# Patient Record
Sex: Female | Born: 1965 | Race: White | Hispanic: No | Smoking: Never smoker
Health system: Southern US, Community
[De-identification: ages and names within clinical notes are randomized; demographics above are authoritative.]

## PROBLEM LIST (undated history)

## (undated) DIAGNOSIS — G9349 Other encephalopathy: Secondary | ICD-10-CM

## (undated) DIAGNOSIS — K589 Irritable bowel syndrome without diarrhea: Secondary | ICD-10-CM

## (undated) DIAGNOSIS — F32A Depression, unspecified: Secondary | ICD-10-CM

## (undated) DIAGNOSIS — E039 Hypothyroidism, unspecified: Secondary | ICD-10-CM

## (undated) DIAGNOSIS — I639 Cerebral infarction, unspecified: Secondary | ICD-10-CM

## (undated) DIAGNOSIS — F329 Major depressive disorder, single episode, unspecified: Secondary | ICD-10-CM

## (undated) DIAGNOSIS — F419 Anxiety disorder, unspecified: Secondary | ICD-10-CM

## (undated) DIAGNOSIS — E669 Obesity, unspecified: Secondary | ICD-10-CM

## (undated) HISTORY — DX: Major depressive disorder, single episode, unspecified: F32.9

## (undated) HISTORY — DX: Hypothyroidism, unspecified: E03.9

## (undated) HISTORY — DX: Depression, unspecified: F32.A

## (undated) HISTORY — DX: Other encephalopathy: G93.49

## (undated) HISTORY — DX: Anxiety disorder, unspecified: F41.9

## (undated) HISTORY — DX: Irritable bowel syndrome, unspecified: K58.9

## (undated) HISTORY — DX: Obesity, unspecified: E66.9

---

## 2007-06-29 HISTORY — PX: COLONOSCOPY: SHX174

## 2014-07-08 HISTORY — PX: VAGINA SURGERY: SHX829

## 2015-01-31 DIAGNOSIS — R32 Unspecified urinary incontinence: Secondary | ICD-10-CM | POA: Insufficient documentation

## 2015-01-31 DIAGNOSIS — R159 Full incontinence of feces: Secondary | ICD-10-CM | POA: Insufficient documentation

## 2015-01-31 DIAGNOSIS — E039 Hypothyroidism, unspecified: Secondary | ICD-10-CM | POA: Insufficient documentation

## 2015-01-31 DIAGNOSIS — Z8673 Personal history of transient ischemic attack (TIA), and cerebral infarction without residual deficits: Secondary | ICD-10-CM | POA: Insufficient documentation

## 2015-01-31 DIAGNOSIS — F3130 Bipolar disorder, current episode depressed, mild or moderate severity, unspecified: Secondary | ICD-10-CM | POA: Insufficient documentation

## 2015-05-03 DIAGNOSIS — I639 Cerebral infarction, unspecified: Secondary | ICD-10-CM | POA: Insufficient documentation

## 2015-06-28 DIAGNOSIS — H5347 Heteronymous bilateral field defects: Secondary | ICD-10-CM | POA: Insufficient documentation

## 2015-11-27 DIAGNOSIS — H34233 Retinal artery branch occlusion, bilateral: Secondary | ICD-10-CM | POA: Insufficient documentation

## 2015-11-27 DIAGNOSIS — H53433 Sector or arcuate defects, bilateral: Secondary | ICD-10-CM | POA: Insufficient documentation

## 2015-11-27 DIAGNOSIS — H2513 Age-related nuclear cataract, bilateral: Secondary | ICD-10-CM | POA: Insufficient documentation

## 2016-01-24 ENCOUNTER — Encounter: Payer: Self-pay | Admitting: Sports Medicine

## 2016-01-24 ENCOUNTER — Ambulatory Visit (INDEPENDENT_AMBULATORY_CARE_PROVIDER_SITE_OTHER): Payer: BC Managed Care – PPO | Admitting: Sports Medicine

## 2016-01-24 ENCOUNTER — Encounter (INDEPENDENT_AMBULATORY_CARE_PROVIDER_SITE_OTHER): Payer: Self-pay

## 2016-01-24 ENCOUNTER — Ambulatory Visit: Payer: BC Managed Care – PPO

## 2016-01-24 DIAGNOSIS — M79672 Pain in left foot: Secondary | ICD-10-CM | POA: Diagnosis not present

## 2016-01-24 DIAGNOSIS — M25872 Other specified joint disorders, left ankle and foot: Secondary | ICD-10-CM

## 2016-01-24 DIAGNOSIS — M2042 Other hammer toe(s) (acquired), left foot: Secondary | ICD-10-CM

## 2016-01-24 NOTE — Progress Notes (Signed)
Patient ID: Margaret Briggs, female   DOB: 1965/09/23, 50 y.o.   MRN: [id number] Subjective: Margaret Briggs is a 50 y.o. female patient who presents to office for evaluation of Left foot pain. Patient complains of progressive pain especially over the last 3 weeks at left foot at the 2nd toe admits that she notices the 2nd and 3rd toes drifting apart. Denies injury or causative factor. Admits to fam hx of bunions and hammertoes. Has tried buddy taping left 2-3 toe together with no relief. Patient denies any other pedal complaints.   Patient Active Problem List   Diagnosis Date Noted  . 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 Cerebrovascular accident (CVA) ([place]) 05/03/2015  . Acquired hypothyroidism 01/31/2015  . Bipolar I disorder, most recent episode depressed ([place]) 01/31/2015  . Alteration in bowel elimination: incontinence 01/31/2015  . Cerebrovascular accident, old 01/31/2015  . Absence of bladder continence 01/31/2015    No current outpatient prescriptions on file prior to visit.   No current facility-administered medications on file prior to visit.    No Known Allergies  Objective:  General: Alert and oriented x3 in no acute distress  Dermatology: Minimal hyperkeratotic lesion overlying 2 PIPJ dorsally Left>Right. No open lesions bilateral lower extremities, no webspace macerations, no ecchymosis bilateral, all nails x 10 are well manicured.  Vascular: Dorsalis Pedis and Posterior Tibial pedal pulses 2/4, Capillary Fill Time 3 seconds,(+) pedal hair growth bilateral, no edema bilateral lower extremities, Temperature gradient within normal limits.  Neurology: Johney Maine sensation intact via light touch bilateral, Protective sensation intact  with Semmes Weinstein Monofilament to all pedal sites, Position sense intact, vibratory intact bilateral, Deep tendon reflexes within normal limits bilateral, No babinski sign present bilateral. (- )Tinels sign bilateral.   Musculoskeletal: Semi-flexible hammertoes  2 L>R with Mild tenderness with palpation at 2nd MTPJ on left with significant medial dislocation with lachman present, Ankle, Subtalar, Midtarsal, and other MTPJs joint range of motion is within normal limits, there is no 1st ray hypermobility noted bilateral, No significant bunion deformity noted bilateral. No pain with calf compression bilateral.  Strength within normal limits in all groups bilateral.   Xrays  Left Foot    Impression:Normal osseous mineralization, Calcaneal spurs present, splaying of 2-3 toes with medial dislocation at 2nd MTPJ, no fracture, soft tissues within normal limits, no foreign body.       Assessment and Plan: Problem List Items Addressed This Visit    None    Visit Diagnoses    Left foot pain    -  Primary    Relevant Orders    DG Foot 2 Views Left    Hammertoe, left        Predislocation syndrome of metatarsophalangeal joint, left        2nd        -Complete examination performed -Xrays reviewed -Discussed treatement options for hammertoe with pre-dislocation syndrome L>R -Patient decline injection therapy  -Taped and strapped left 2nd toe to alievate pain at MTPJ and also supplied met pads to use as needed -Advised patient that symptoms worsens may benefit from MRI and injection -Patient to return to office as needed or sooner if condition worsens.  Landis Martins, DPM

## 2016-04-23 ENCOUNTER — Other Ambulatory Visit: Payer: Self-pay | Admitting: Physician Assistant

## 2016-04-23 DIAGNOSIS — S43431A Superior glenoid labrum lesion of right shoulder, initial encounter: Secondary | ICD-10-CM

## 2016-05-03 ENCOUNTER — Ambulatory Visit
Admission: RE | Admit: 2016-05-03 | Discharge: 2016-05-03 | Disposition: A | Discharge status: Home / Self Care | Payer: BC Managed Care – PPO | Source: Ambulatory Visit | Attending: Physician Assistant | Admitting: Physician Assistant

## 2016-05-03 DIAGNOSIS — X58XXXA Exposure to other specified factors, initial encounter: Secondary | ICD-10-CM | POA: Insufficient documentation

## 2016-05-03 DIAGNOSIS — S43431A Superior glenoid labrum lesion of right shoulder, initial encounter: Secondary | ICD-10-CM | POA: Diagnosis not present

## 2016-05-03 HISTORY — DX: Cerebral infarction, unspecified: I63.9

## 2016-09-16 NOTE — Addendum Note (Signed)
Encounter addended by: Arnitra Sokoloski S Rachelann Enloe, RT on: 09/16/2016 11:03 AM<BR>    Actions taken: Imaging Exam ended, Charge Capture section accepted

## 2017-05-22 IMAGING — US US ASPIRATION
1 series · 8 of 8 positions shown · non-contrast
Comparison: MRI a right shoulder 03/12/2016

CLINICAL DATA: Right posterior paralabral cyst with shoulder pain.

EXAM:
ULTRASOUND GUIDED RIGHT POSTERIOR PARALABRAL CYST ASPIRATION

[Series 1: us aspiration · 0.07mm/px · 8 of 8 slices shown]
[im 1/8]
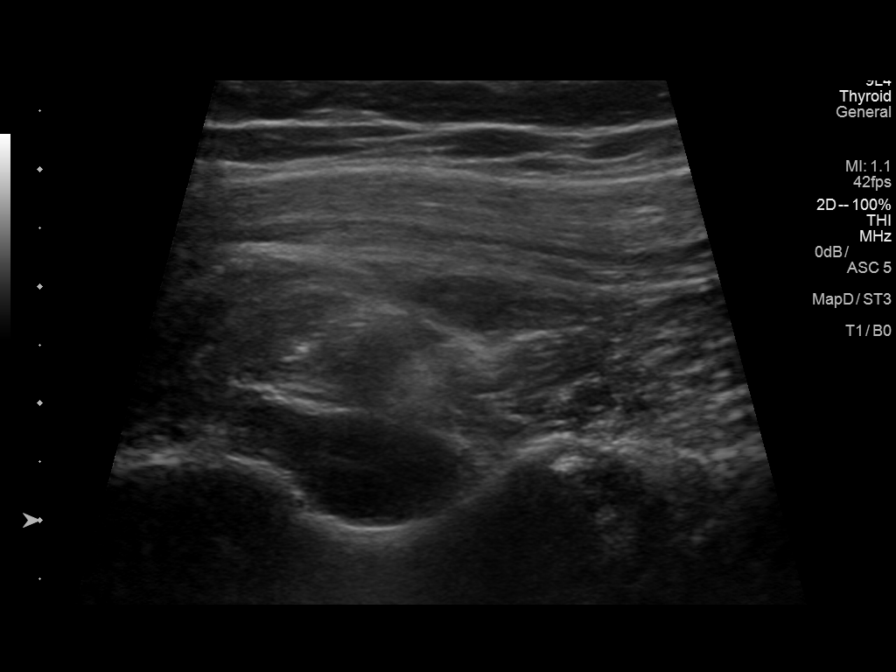
[im 2/8]
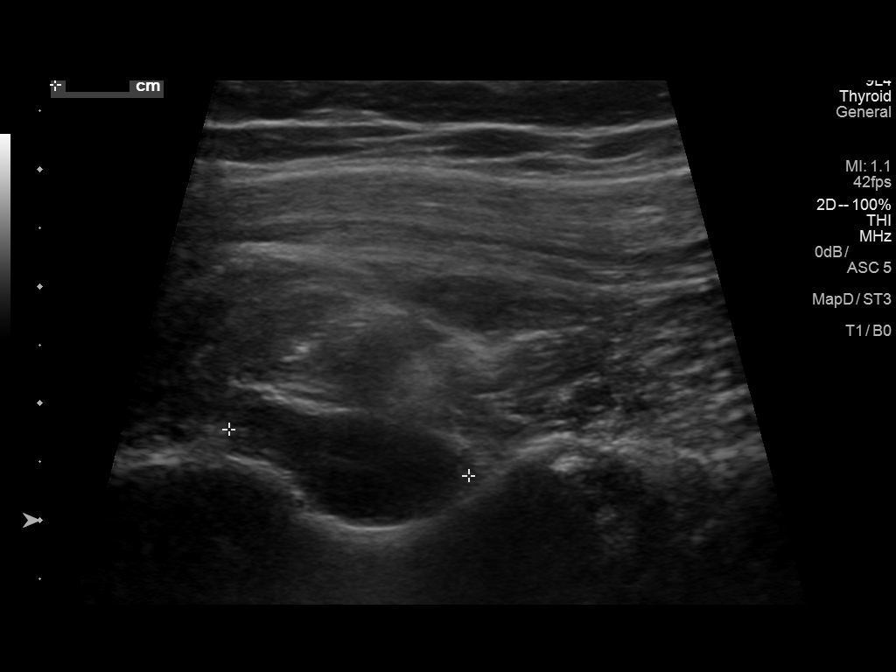
[im 3/8]
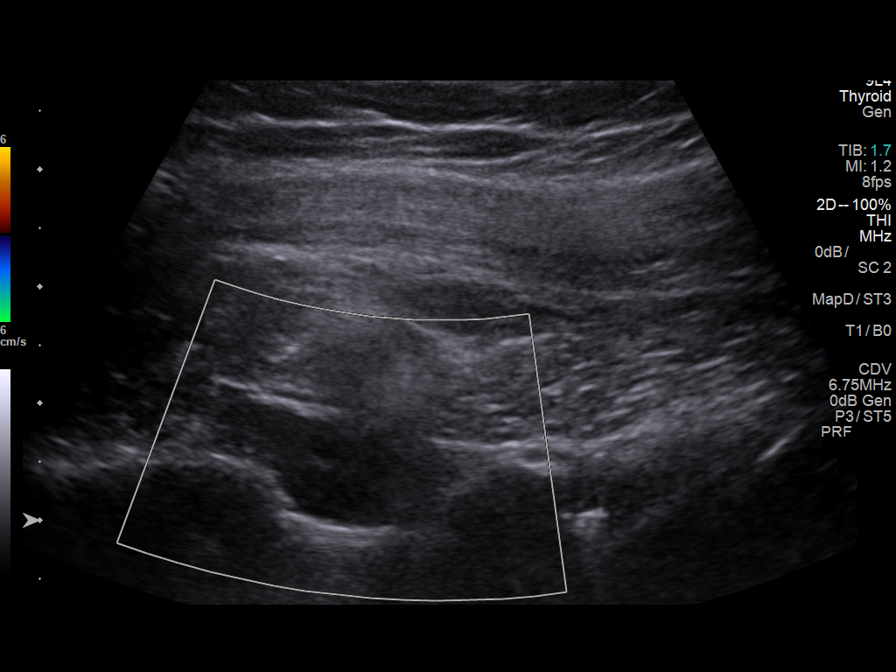
[im 4/8]
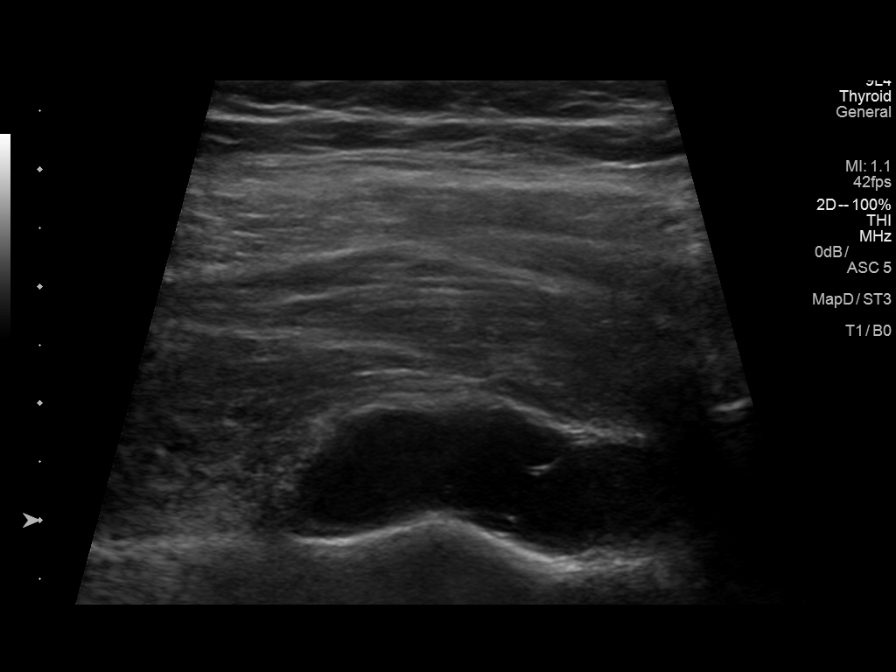
[im 5/8]
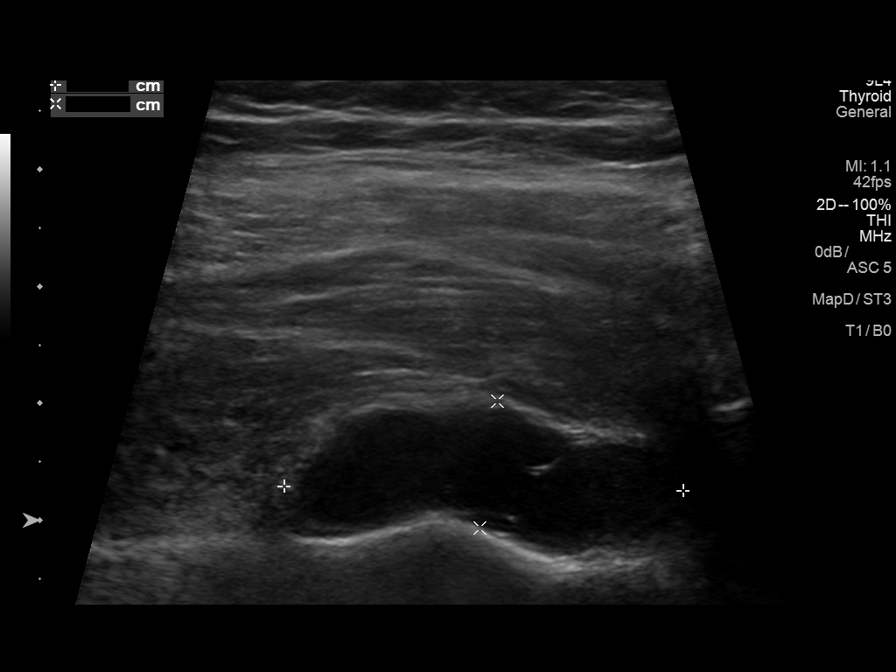
[im 6/8]
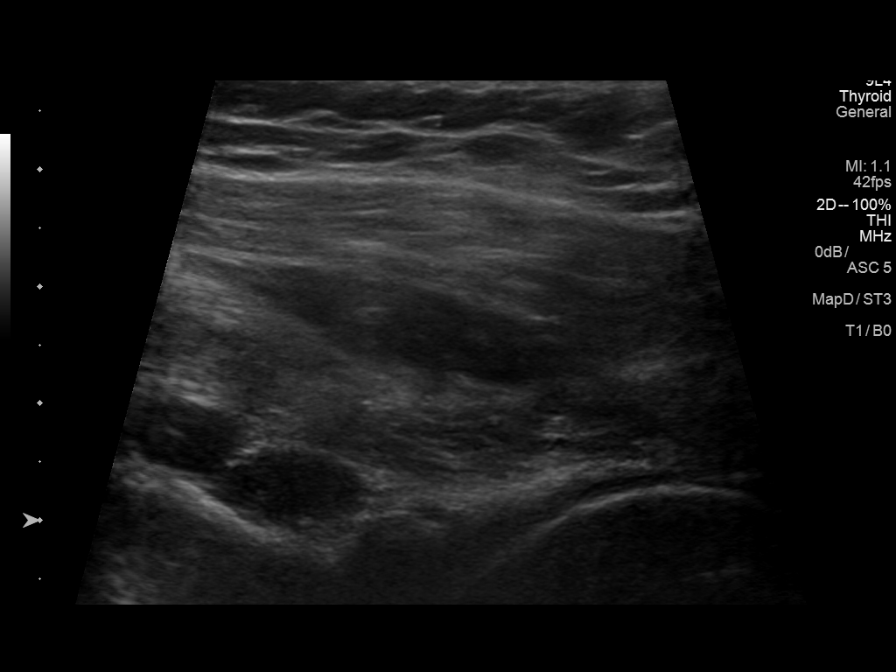
[im 7/8]
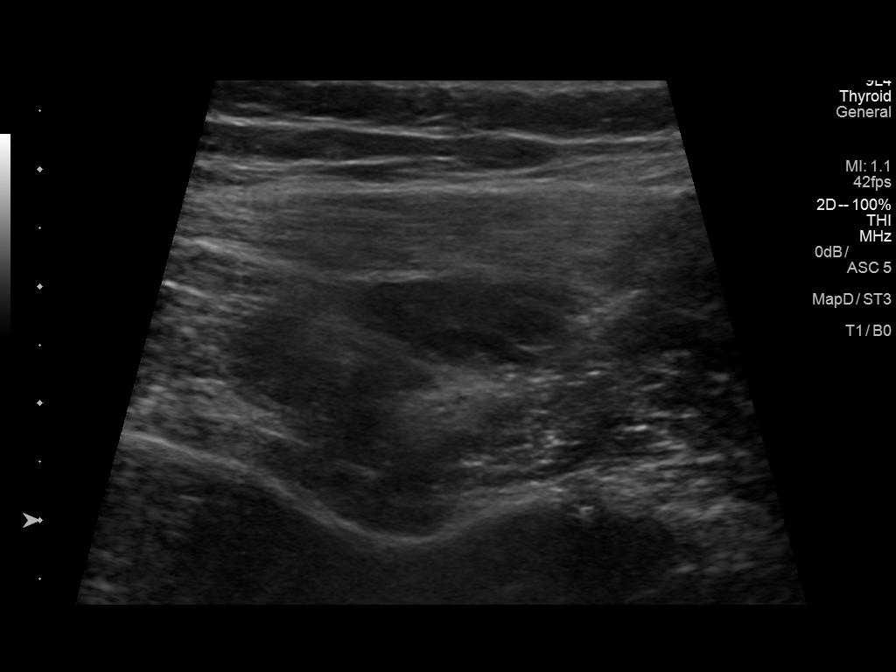
[im 8/8]
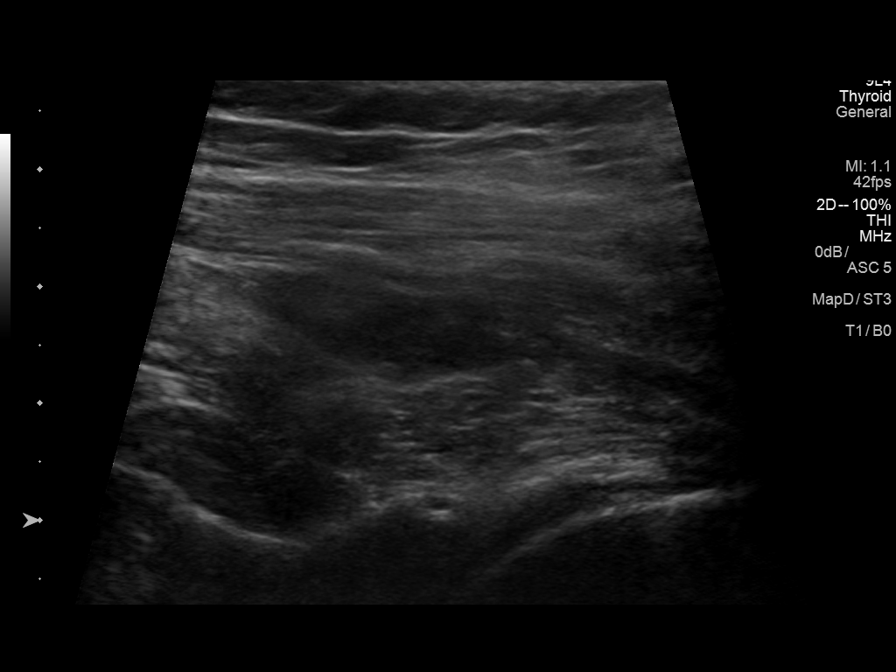

[8 of 8 positions shown; findings below may reference images not displayed]

PROCEDURE:
Using sterile technique, 1% lidocaine, under direct ultrasound
visualization, needle aspiration of a right posterior paralabral
cyst was performed.

After informed consent was obtained explaining the risks, benefits,
and possible complications of the procedure, the patient was placed
supine on the fluoroscopy table. A formal time procedure was
performed according to department of protocol.

The right posterior shoulder was prepped and draped in the usual
sterile fashion. 1% lidocaine was used to anesthetize the skin. A 18
gauge spinal needle was advanced into the right posterior paralabral
cyst under real-time sonography. 7 mL of thick viscous synovial
fluid was aspirated.

Subsequently, 5 ml of bupivacaine and 60 mg of Depo-Medrol were
injected into the right posterior paralabral cyst.

Hemostasis was achieved. Patient tolerated the procedure well. There
were no immediate complications.
IMPRESSION: Ultrasound-guided aspiration of a right posterior paralabral cyst.
No apparent complications.

RECOMMENDATIONS:
None.

## 2017-12-09 ENCOUNTER — Encounter: Payer: Self-pay | Admitting: Gastroenterology

## 2018-01-09 ENCOUNTER — Encounter: Payer: Self-pay | Admitting: Gastroenterology

## 2018-01-20 ENCOUNTER — Ambulatory Visit (INDEPENDENT_AMBULATORY_CARE_PROVIDER_SITE_OTHER): Payer: BC Managed Care – PPO | Admitting: Gastroenterology

## 2018-01-20 ENCOUNTER — Encounter: Payer: Self-pay | Admitting: Gastroenterology

## 2018-01-20 VITALS — BP 104/60 | HR 78 | Ht 68.0 in | Wt 250.0 lb

## 2018-01-20 DIAGNOSIS — Z1211 Encounter for screening for malignant neoplasm of colon: Secondary | ICD-10-CM | POA: Diagnosis not present

## 2018-01-20 MED ORDER — SOD PICOSULFATE-MAG OX-CIT ACD 10-3.5-12 MG-GM -GM/160ML PO SOLN: 1.0000 | Freq: Once | ORAL | 0 refills | Status: AC

## 2018-01-20 NOTE — Patient Instructions (Addendum)
If you are age 965 or older, your body mass index should be between 23-30. Your Body mass index is 38.01 kg/m. If this is out of the aforementioned range listed, please consider follow up with your Primary Care Provider.  If you are age 50 or younger, your body mass index should be between 19-25. Your Body mass index is 38.01 kg/m. If this is out of the aformentioned range listed, please consider follow up with your Primary Care Provider.   You have been scheduled for a colonoscopy. Please follow written instructions given to you at your visit today.  Please pick up your prep supplies at the pharmacy within the next 1-3 days. If you use inhalers (even only as needed), please bring them with you on the day of your procedure. Your physician has requested that you go to www.startemmi.com and enter the access code given to you at your visit today. This web site gives a general overview about your procedure. However, you should still follow specific instructions given to you by our office regarding your preparation for the procedure.  We have sent the following medications to your pharmacy for you to pick up at your convenience: Clenpiq   Thank you,  Dr. Lynann Bolognaajesh Gupta

## 2018-01-20 NOTE — Progress Notes (Signed)
Chief Complaint:   Referring Provider:  Philemon Kingdom, MD      ASSESSMENT AND PLAN;   #1.  Colorectal cancer screening - Proceed with colonoscopy.  I have discussed the risks and benefits.  The risks including risk of perforation requiring laparotomy, bleeding after polypectomy requiring blood transfusions and risks of anesthesia/sedation were discussed.  Rare risks of missing colorectal neoplasms were also discussed.  Alternatives were given.  Patient is fully aware and agrees to proceed. All the questions were answered. Colonoscopy will be scheduled in upcoming days.  Patient is to report immediately if there is any significant weight loss or excessive bleeding until then. Consent forms were given for review.  #2.  IBS with alternating constipation and diarrhea. -Continue Levsin 0.125 mg sublingual as needed.   HPI:    Margaret Briggs is a 52 y.o. 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 With a lot of mucus but no blood No nocturnal symptoms. No upper GI symptoms   Past GI history: -Had colonoscopy 06/29/2007 ([facility])-negative, No UC  (? UC 1996)   Past Medical History:  Diagnosis Date  . Anxiety and depression   . Hypothyroid   . IBS (irritable bowel syndrome)   . Obesity   . Stroke ([facility])   . Susac's syndrome     Past Surgical History:  Procedure Laterality Date  . COLONOSCOPY  06/29/2007   Minimal eryhtema of tje rectum questionavle inmportance up to 10 cm. Small internal hemorrhoids.   Marland Kitchen VAGINA SURGERY  2016   vaginal wall repair    Family History  Problem Relation Age of Onset  . Breast cancer Mother        Died at age 45  . Osteoporosis Mother   . Rheum arthritis Mother   . Irritable bowel syndrome Father   . Colon cancer Neg Hx     Social History   Tobacco Use  . Smoking status: Never Smoker  . Smokeless tobacco: Never Used  Substance Use Topics  . Alcohol use: No   Alcohol/week: 0.0 oz  . Drug use: No    Current Outpatient Medications  Medication Sig Dispense Refill  . ALPRAZolam (XANAX) 0.5 MG tablet Take by mouth.    Marland Kitchen aspirin 81 MG chewable tablet Chew 81 mg by mouth daily.    . citalopram (CELEXA) 40 MG tablet Take by mouth.    . hyoscyamine (LEVSIN, ANASPAZ) 0.125 MG tablet Take 0.125 mg by mouth every 6 (six) hours as needed.    . lamoTRIgine (LAMICTAL) 150 MG tablet Take by mouth.    . lamoTRIgine (LAMICTAL) 150 MG tablet     . levothyroxine (SYNTHROID, LEVOTHROID) 100 MCG tablet Take by mouth.    . levothyroxine (SYNTHROID, LEVOTHROID) 100 MCG tablet     . mycophenolate (CELLCEPT) 500 MG tablet      No current facility-administered medications for this visit.     No Known Allergies  Review of Systems:  Constitutional: Denies fever, chills, diaphoresis, appetite change and fatigue.  HEENT: Denies photophobia, eye pain, redness, hearing loss, ear pain, congestion, sore throat, rhinorrhea, sneezing, mouth sores, neck pain, neck stiffness and tinnitus.   Respiratory: Denies SOB, DOE, cough, chest tightness,  and wheezing.   Cardiovascular: Denies chest pain, palpitations and leg swelling.  Genitourinary: Denies dysuria, urgency, frequency, hematuria, flank pain and difficulty urinating.  Musculoskeletal: Denies myalgias, back pain, joint swelling, arthralgias and gait problem.  Skin: No  rash.  Neurological: Denies dizziness, seizures, syncope, weakness, light-headedness, numbness and headaches.  Hematological: Denies adenopathy. Easy bruising, personal or family bleeding history  Psychiatric/Behavioral: Has anxiety or depression     Physical Exam:    BP 104/60   Pulse 78   Ht 5\' 8"  (1.727 m)   Wt 250 lb (113.4 kg)   BMI 38.01 kg/m  Filed Weights   01/20/18 1059  Weight: 250 lb (113.4 kg)   Constitutional:  Well-developed, in no acute distress. Psychiatric: Normal mood and affect. Behavior is normal. HEENT: Pupils normal.   Conjunctivae are normal. No scleral icterus. Neck supple.  Cardiovascular: Normal rate, regular rhythm. No edema Pulmonary/chest: Effort normal and breath sounds normal. No wheezing, rales or rhonchi. Abdominal: Soft, nondistended. Nontender. Bowel sounds active throughout. There are no masses palpable. No hepatomegaly. Rectal:  defered Neurological: Alert and oriented to person place and time. Skin: Skin is warm and dry. No rashes noted.  Seen in presence of Oliver BarreHeather Cole CMA.    Edman Circleaj Raed Schalk, MD 01/20/2018, 11:13 AM  Cc: Philemon KingdomProchnau, Caroline, MD

## 2018-01-23 ENCOUNTER — Encounter: Payer: Self-pay | Admitting: Gastroenterology

## 2018-02-06 ENCOUNTER — Encounter: Payer: Self-pay | Admitting: Gastroenterology

## 2018-02-06 ENCOUNTER — Ambulatory Visit (AMBULATORY_SURGERY_CENTER): Payer: BC Managed Care – PPO | Admitting: Gastroenterology

## 2018-02-06 VITALS — BP 128/82 | HR 71 | Temp 98.7°F | Resp 16 | Ht 68.0 in | Wt 250.0 lb

## 2018-02-06 DIAGNOSIS — Z1211 Encounter for screening for malignant neoplasm of colon: Secondary | ICD-10-CM

## 2018-02-06 MED ORDER — SODIUM CHLORIDE 0.9 % IV SOLN: 500.0000 mL | Freq: Once | INTRAVENOUS | Status: DC

## 2018-02-06 NOTE — Op Note (Signed)
[facility] Patient Name: Margaret Briggs Procedure Date: 02/06/2018 3:29 PM MRN: [id number] Endoscopist: Lynann Bologna , MD Age: 52 Referring MD:  Date of Birth: [birth year] Gender: Female Account #: [id number] Procedure:                Colonoscopy Indications:              Screening for colorectal malignant neoplasm Medicines:                Monitored Anesthesia Care Procedure:                Pre-Anesthesia Assessment:                           - Prior to the procedure, a History and Physical                            was performed, and patient medications and                            allergies were reviewed. The patient's tolerance of                            previous anesthesia was also reviewed. The risks                            and benefits of the procedure and the sedation                            options and risks were discussed with the patient.                            All questions were answered, and informed consent                            was obtained. Prior Anticoagulants: The patient has                            taken no previous anticoagulant or antiplatelet                            agents. ASA Grade Assessment: II - A patient with                            mild systemic disease. After reviewing the risks                            and benefits, the patient was deemed in                            satisfactory condition to undergo the procedure.                           After obtaining informed consent, the colonoscope  was passed under direct vision. Throughout the                            procedure, the patient's blood pressure, pulse, and                            oxygen saturations were monitored continuously. The                            Model PCF-[id number]) scope was introduced                            through the anus and advanced to the 2 cm into the                            ileum. The  colonoscopy was performed without                            difficulty. The patient tolerated the procedure                            well. The quality of the bowel preparation was good. Scope In: 3:36:23 PM Scope Out: 3:49:00 PM Scope Withdrawal Time: 0 hours 6 minutes 23 seconds  Total Procedure Duration: 0 hours 12 minutes 37 seconds  Findings:                 Non-bleeding internal hemorrhoids were found during                            retroflexion. The hemorrhoids were small.                           The exam was otherwise without abnormality on                            direct and retroflexion views. Complications:            No immediate complications. Estimated Blood Loss:     Estimated blood loss: none. Impression:               - Non-bleeding internal hemorrhoids.                           - Otherwise normal colonoscopy to terminal ileum. Recommendation:           - Patient has a contact number available for                            emergencies. The signs and symptoms of potential                            delayed complications were discussed with the                            patient. Return to normal activities tomorrow.  Written discharge instructions were provided to the                            patient.                           - Resume previous diet.                           - Continue present medications.                           - Repeat colonoscopy in 10 years for screening                            purposes. Earlier, if any new problems or if there                            is any change in family history.                           - Return to GI clinic PRN. Lynann Bologna, MD 02/06/2018 3:54:44 PM This report has been signed electronically.

## 2018-02-06 NOTE — Patient Instructions (Signed)
Thank you for allowing us to care for you today!  Resume previous medications and diet.     YOU HAD AN ENDOSCOPIC PROCEDURE TODAY AT THE [facility]:   Refer to the procedure report that was given to you for any specific questions about what was found during the examination.  If the procedure report does not answer your questions, please call your gastroenterologist to clarify.  If you requested that your care partner not be given the details of your procedure findings, then the procedure report has been included in a sealed envelope for you to review at your convenience later.  YOU SHOULD EXPECT: Some feelings of bloating in the abdomen. Passage of more gas than usual.  Walking can help get rid of the air that was put into your GI tract during the procedure and reduce the bloating. If you had a lower endoscopy (such as a colonoscopy or flexible sigmoidoscopy) you may notice spotting of blood in your stool or on the toilet paper. If you underwent a bowel prep for your procedure, you may not have a normal bowel movement for a few days.  Please Note:  You might notice some irritation and congestion in your nose or some drainage.  This is from the oxygen used during your procedure.  There is no need for concern and it should clear up in a day or so.  SYMPTOMS TO REPORT IMMEDIATELY:   Following lower endoscopy (colonoscopy or flexible sigmoidoscopy):  Excessive amounts of blood in the stool  Significant tenderness or worsening of abdominal pains  Swelling of the abdomen that is new, acute  Fever of 100F or higher    For urgent or emergent issues, a gastroenterologist can be reached at any hour by calling [phone].   DIET:  We do recommend a small meal at first, but then you may proceed to your regular diet.  Drink plenty of fluids but you should avoid alcoholic beverages for 24 hours.  ACTIVITY:  You should plan to take it easy for the rest of today and you should NOT  DRIVE or use heavy machinery until tomorrow (because of the sedation medicines used during the test).    FOLLOW UP: Our staff will call the number listed on your records the next business day following your procedure to check on you and address any questions or concerns that you may have regarding the information given to you following your procedure. If we do not reach you, we will leave a message.  However, if you are feeling well and you are not experiencing any problems, there is no need to return our call.  We will assume that you have returned to your regular daily activities without incident.  If any biopsies were taken you will be contacted by phone or by letter within the next 1-3 weeks.  Please call us at [phone] if you have not heard about the biopsies in 3 weeks.    SIGNATURES/CONFIDENTIALITY: You and/or your care partner have signed paperwork which will be entered into your electronic medical record.  These signatures attest to the fact that that the information above on your After Visit Summary has been reviewed and is understood.  Full responsibility of the confidentiality of this discharge information lies with you and/or your care-partner.

## 2018-02-09 ENCOUNTER — Telehealth: Payer: Self-pay | Admitting: *Deleted

## 2018-02-09 NOTE — Telephone Encounter (Signed)
  Follow up Call-  Call back number 02/06/2018  Post procedure Call Back phone  # [phone]  Permission to leave phone message Yes  Some recent data might be hidden     No answer at # given.  No VM. Unable to leave a message.

## 2018-02-09 NOTE — Telephone Encounter (Signed)
  Follow up Call-  Call back number 02/06/2018  Post procedure Call Back phone  # [phone]  Permission to leave phone message Yes  Some recent data might be hidden     Patient questions:  Do you have a fever, pain , or abdominal swelling? No. Pain Score  0 *  Have you tolerated food without any problems? Yes.    Have you been able to return to your normal activities? Yes.    Do you have any questions about your discharge instructions: Diet   No. Medications  No. Follow up visit  No.  Do you have questions or concerns about your Care? No.  Actions: * If pain score is 4 or above: No action needed, pain <4.

## 2018-05-12 ENCOUNTER — Other Ambulatory Visit: Payer: Self-pay | Admitting: Physician Assistant

## 2018-07-03 ENCOUNTER — Ambulatory Visit: Payer: Self-pay | Admitting: Physician Assistant

## 2018-07-04 ENCOUNTER — Other Ambulatory Visit: Payer: Self-pay | Admitting: Physician Assistant

## 2018-07-05 NOTE — Telephone Encounter (Signed)
Need to review paper chart not seen in epic yet  

## 2018-08-04 ENCOUNTER — Ambulatory Visit: Payer: Self-pay | Admitting: Physician Assistant

## 2018-08-17 ENCOUNTER — Other Ambulatory Visit: Payer: Self-pay | Admitting: Physician Assistant

## 2018-08-18 NOTE — Telephone Encounter (Signed)
Need to review paper chart  

## 2018-08-18 NOTE — Telephone Encounter (Signed)
Not seen since 10/23/2017 Follow up due in January   cxl 12/27 and 01/28  Next office visit  09/21/2018  Requesting refill for celexa, ok?   Chart on my desk

## 2018-08-19 NOTE — Telephone Encounter (Signed)
Yes, enough to get to appt.  Thanks

## 2018-09-21 ENCOUNTER — Ambulatory Visit: Payer: Self-pay | Admitting: Physician Assistant

## 2018-10-04 ENCOUNTER — Other Ambulatory Visit: Payer: Self-pay | Admitting: Physician Assistant

## 2018-10-05 ENCOUNTER — Ambulatory Visit (INDEPENDENT_AMBULATORY_CARE_PROVIDER_SITE_OTHER): Payer: BC Managed Care – PPO | Admitting: Physician Assistant

## 2018-10-05 ENCOUNTER — Encounter: Payer: Self-pay | Admitting: Physician Assistant

## 2018-10-05 DIAGNOSIS — F331 Major depressive disorder, recurrent, moderate: Secondary | ICD-10-CM | POA: Diagnosis not present

## 2018-10-05 DIAGNOSIS — F411 Generalized anxiety disorder: Secondary | ICD-10-CM | POA: Diagnosis not present

## 2018-10-05 MED ORDER — BUPROPION HCL ER (XL) 150 MG PO TB24: 150.0000 mg | ORAL_TABLET | Freq: Every day | ORAL | 1 refills | Status: DC

## 2018-10-05 MED ORDER — ALPRAZOLAM 0.5 MG PO TABS: 0.5000 mg | ORAL_TABLET | Freq: Two times a day (BID) | ORAL | 2 refills | Status: DC | PRN

## 2018-10-05 NOTE — Progress Notes (Signed)
[facility]  Patient ID: ICIE SMATHERS,  MRN: [id number]  PCP: Philemon Kingdom, MD  Date of Evaluation: 10/05/2018 Time spent:15 minutes  Chief Complaint:  Chief Complaint    Follow-up     Virtual Visit via Telephone Note  I connected with Margaret Briggs on 10/05/18 at  4:30 PM EDT by telephone and verified that I am speaking with the correct person using two identifiers.   I discussed the limitations, risks, security and privacy concerns of performing an evaluation and management service by telephone and the availability of in person appointments. I also discussed with the patient that there may be a patient responsible charge related to this service. The patient expressed understanding and agreed to proceed.    HISTORY/CURRENT STATUS: HPI  for routine med check.  States overall she is doing quite well.  She does feel some lassitude however.  No motivation to do much of anything.  She feels that menopause has played some part in the way she feels.  She does go to work as normal, is a Runner, broadcasting/film/video.  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  "I am really okay.  But I was wondering if we could tweak something and then I might feel a little bit more energetic with more motivation."  She is able to enjoy things.  She does not isolate.  She does not cry easily.  She sleeps well.  She has been a little bit more anxious lately due to the coronavirus pandemic.  She does use Xanax several days a week.  Denies suicidal or homicidal thoughts.  Denies muscle or joint pain, stiffness, or dystonia.  Denies dizziness, syncope, seizures, numbness, tingling, tremor, tics, unsteady gait, slurred speech, confusion.   Individual Medical History/ Review of Systems: Changes? :No   Past medications for mental health diagnoses include: Prozac, Wellbutrin, Paxil, Cymbalta ?  Allergies: Patient has no known  allergies.  Current Medications:  Current Outpatient Medications:  .  ALPRAZolam (XANAX) 0.5 MG tablet, Take 1 tablet (0.5 mg total) by mouth 2 (two) times daily as needed for anxiety., Disp: 60 tablet, Rfl: 2 .  aspirin 81 MG chewable tablet, Chew 81 mg by mouth daily., Disp: , Rfl:  .  citalopram (CELEXA) 40 MG tablet, TAKE 1 TABLET BY MOUTH ONCE DAILY IN THE MORNING -  NEEDS  OFFICE  VISIT, Disp: 33 tablet, Rfl: 0 .  hyoscyamine (LEVSIN, ANASPAZ) 0.125 MG tablet, Take 0.125 mg by mouth every 6 (six) hours as needed., Disp: , Rfl:  .  lamoTRIgine (LAMICTAL) 150 MG tablet, TAKE 1 TABLET BY MOUTH ONCE DAILY, Disp: 90 tablet, Rfl: 0 .  levothyroxine (SYNTHROID, LEVOTHROID) 100 MCG tablet, Take by mouth., Disp: , Rfl:  .  buPROPion (WELLBUTRIN XL) 150 MG 24 hr tablet, Take 1 tablet (150 mg total) by mouth daily., Disp: 30 tablet, Rfl: 1 .  mycophenolate (CELLCEPT) 500 MG tablet, , Disp: , Rfl:  Medication Side Effects: none  Family Medical/ Social History: Changes? No  MENTAL HEALTH EXAM:  There were no vitals taken for this visit.There is no height or weight on file to calculate BMI.  General Appearance: phone visit.  Unable to assess  Eye Contact:  unable to assess  Speech:  Clear and Coherent  Volume:  Normal  Mood:  Euthymic  Affect:  unable to assess  Thought Process:  Goal Directed  Orientation:  Full (Time, Place, and Person)  Thought Content: Logical   Suicidal Thoughts:  No  Homicidal Thoughts:  No  Memory:  WNL  Judgement:  Good  Insight:  Good  Psychomotor Activity:  unable to assess   Concentration:  Concentration: Good  Recall:  Good  Fund of Knowledge: Good  Language: Good  Assets:  Desire for Improvement  ADL's:  Intact  Cognition: WNL  Prognosis:  Good    DIAGNOSES:    ICD-10-CM   1. Major depressive disorder, recurrent episode, moderate (HCC) F33.1   2. Generalized anxiety disorder F41.1     Receiving Psychotherapy: No    RECOMMENDATIONS: We  discussed different options.  Recommend add Wellbutrin.  She is taking it in the past but does not remember whether it was effective or not.  She does remember that there were no side effects to it.  She is agreeable to try it. Start Wellbutrin XL 150 mg p.o. every morning. Continue Celexa 40 mg p.o. daily. Continue Xanax 0.5 mg twice daily as needed. Discussed ways to decompress during this anxiety provoking time. Return in 4 to 6 weeks.   Melony Overly, PA-C   This record has been created using [employer].  Chart creation errors have been sought, but may not always have been located and corrected. Such creation errors do not reflect on the standard of medical care.

## 2018-10-05 NOTE — Telephone Encounter (Signed)
Has phone visit 10/05/2018 with provider

## 2018-11-16 ENCOUNTER — Telehealth: Payer: Self-pay | Admitting: Physician Assistant

## 2018-11-16 NOTE — Telephone Encounter (Signed)
ELISANDRA ZETTS called to report that the new medicine caused insomnia "big time".  So she stopped it.  Next appt 12/04/18

## 2018-11-16 NOTE — Telephone Encounter (Signed)
Spoke with pt. And she says that is fine and thank you.

## 2018-11-16 NOTE — Telephone Encounter (Signed)
Let her know I'm sorry that happened!  If she's okay w/ this, I'd prefer to wait till her appt to discuss further and decide what to do.

## 2018-12-04 ENCOUNTER — Other Ambulatory Visit: Payer: Self-pay

## 2018-12-04 ENCOUNTER — Ambulatory Visit (INDEPENDENT_AMBULATORY_CARE_PROVIDER_SITE_OTHER): Payer: BC Managed Care – PPO | Admitting: Physician Assistant

## 2018-12-04 ENCOUNTER — Encounter: Payer: Self-pay | Admitting: Physician Assistant

## 2018-12-04 DIAGNOSIS — F411 Generalized anxiety disorder: Secondary | ICD-10-CM | POA: Diagnosis not present

## 2018-12-04 DIAGNOSIS — F331 Major depressive disorder, recurrent, moderate: Secondary | ICD-10-CM

## 2018-12-04 MED ORDER — BUPROPION HCL 75 MG PO TABS: 75.0000 mg | ORAL_TABLET | Freq: Two times a day (BID) | ORAL | 1 refills | Status: DC

## 2018-12-04 MED ORDER — CITALOPRAM HYDROBROMIDE 40 MG PO TABS: 40.0000 mg | ORAL_TABLET | Freq: Every day | ORAL | 5 refills | Status: DC

## 2018-12-04 NOTE — Progress Notes (Signed)
[facility]  Patient ID: Margaret Briggs,  MRN: [id number]  PCP: Philemon Kingdom, MD  Date of Evaluation: 12/04/2018 Time spent:15 minutes  Chief Complaint:  Chief Complaint    Follow-up     Virtual Visit via Telephone Note  I connected with patient by a video enabled telemedicine application or telephone, with their informed consent, and verified patient privacy and that I am speaking with the correct person using two identifiers.  I am private, in my home and the patient is home.  I discussed the limitations, risks, security and privacy concerns of performing an evaluation and management service by telephone and the availability of in person appointments. I also discussed with the patient that there may be a patient responsible charge related to this service. The patient expressed understanding and agreed to proceed.   I discussed the assessment and treatment plan with the patient. The patient was provided an opportunity to ask questions and all were answered. The patient agreed with the plan and demonstrated an understanding of the instructions.   The patient was advised to call back or seek an in-person evaluation if the symptoms worsen or if the condition fails to improve as anticipated.  I provided 15 minutes of non-face-to-face time during this encounter.  HISTORY/CURRENT STATUS: HPI for 47-month med check.  At the last visit, we added Wellbutrin.  Margaret Briggs had been having decreased energy and motivation.  She took the Wellbutrin for about 2 weeks but it caused insomnia so she stopped it.  She was not able to tell if it was doing anything for her anyway.  She still feels low energy and motivation and just does what she has to do.  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  "I feel so much better than I did 10 years ago so I do not want to complain.  Also I know that everybody else is dealing with this too.  The  coronavirus has this all a little bit depressed.  I am not suicidal.  I would cry all the time but I just do not feel as happy in excited about life as I have in the past."  She is not really having any anxiety to speak of.  Even through all these changes that are taking place with her job, she is not having panic attacks.  She is sleeping fine now that she is off the Wellbutrin.  Denies dizziness, syncope, seizures, numbness, tingling, tremor, tics, unsteady gait, slurred speech, confusion.   Denies muscle or joint pain, stiffness, or dystonia.  Individual Medical History/ Review of Systems: Changes? :No    Past medications for mental health diagnoses include: Prozac, Wellbutrin, Paxil, Cymbalta ?  Allergies: Patient has no known allergies.  Current Medications:  Current Outpatient Medications:  .  ALPRAZolam (XANAX) 0.5 MG tablet, Take 1 tablet (0.5 mg total) by mouth 2 (two) times daily as needed for anxiety., Disp: 60 tablet, Rfl: 2 .  aspirin 81 MG chewable tablet, Chew 81 mg by mouth daily., Disp: , Rfl:  .  citalopram (CELEXA) 40 MG tablet, Take 1 tablet (40 mg total) by mouth daily., Disp: 30 tablet, Rfl: 5 .  lamoTRIgine (LAMICTAL) 150 MG tablet, TAKE 1 TABLET BY MOUTH ONCE DAILY, Disp: 90 tablet, Rfl: 0 .  levothyroxine (SYNTHROID, LEVOTHROID) 100 MCG tablet, Take by mouth., Disp: , Rfl:  .  buPROPion (WELLBUTRIN) 75 MG tablet, Take 1 tablet (75 mg total)  by mouth 2 (two) times daily., Disp: 60 tablet, Rfl: 1 .  hyoscyamine (LEVSIN, ANASPAZ) 0.125 MG tablet, Take 0.125 mg by mouth every 6 (six) hours as needed., Disp: , Rfl:  .  mycophenolate (CELLCEPT) 500 MG tablet, , Disp: , Rfl:  Medication Side Effects: insomnia from Wellbutrin   Family Medical/ Social History: Changes? Yes coronavirus pandemic has affected her a lot.  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  MENTAL HEALTH EXAM:  There were no  vitals taken for this visit.There is no height or weight on file to calculate BMI.  General Appearance: unable to assess  Eye Contact:  unable to assess  Speech:  Clear and Coherent  Volume:  Normal  Mood:  Depressed  Affect:  Unable to assess  Thought Process:  Goal Directed  Orientation:  Full (Time, Place, and Person)  Thought Content: Logical   Suicidal Thoughts:  No  Homicidal Thoughts:  No  Memory:  WNL  Judgement:  Good  Insight:  Good  Psychomotor Activity:  Unable to assess  Concentration:  Concentration: Good  Recall:  Good  Fund of Knowledge: Good  Language: Good  Assets:  Desire for Improvement  ADL's:  Intact  Cognition: WNL  Prognosis:  Good    DIAGNOSES:    ICD-10-CM   1. Major depressive disorder, recurrent episode, moderate (HCC) F33.1   2. Generalized anxiety disorder F41.1     Receiving Psychotherapy: No    RECOMMENDATIONS:  We discussed different options including retrying Wellbutrin but at a lower dose of the instant release.  She prefers doing that than using Abilify which was the other alternative I proposed.  Even at a low dose, the Wellbutrin may help with energy and motivation yet not cause insomnia.  She understands though that it may not help the depression as much.  A lot of what she is feeling is circumstantial and I believe will improve after she knows more about the coming school year. Start Wellbutrin 75 mg 1 p.o. every morning for at least 2 weeks.  She then can take 1 every morning and 1 around 2 in the afternoon. Continue Celexa 40 mg daily. Continue Xanax 0.5 mg twice daily as needed.  States she rarely takes this. Return in 4 to 6 weeks.   Margaret Overlyeresa Jocelyne Reinertsen, PA-C   This record has been created using [employer] software.  Chart creation errors have been sought, but may not always have been located and corrected. Such creation errors do not reflect on the standard of medical care.

## 2019-01-11 ENCOUNTER — Encounter: Payer: Self-pay | Admitting: Physician Assistant

## 2019-01-11 ENCOUNTER — Ambulatory Visit (INDEPENDENT_AMBULATORY_CARE_PROVIDER_SITE_OTHER): Payer: BC Managed Care – PPO | Admitting: Physician Assistant

## 2019-01-11 ENCOUNTER — Other Ambulatory Visit: Payer: Self-pay

## 2019-01-11 DIAGNOSIS — F411 Generalized anxiety disorder: Secondary | ICD-10-CM

## 2019-01-11 DIAGNOSIS — F331 Major depressive disorder, recurrent, moderate: Secondary | ICD-10-CM | POA: Diagnosis not present

## 2019-01-11 NOTE — Progress Notes (Signed)
[facility]  Patient ID: Margaret Briggs,  MRN: [id number]  PCP: Ernestene Kiel, MD  Date of Evaluation: 01/11/2019 Time spent:15 minutes  Chief Complaint:  Chief Complaint    Depression; Follow-up     Virtual Visit via Telephone Note  I connected with patient by a video enabled telemedicine application or telephone, with their informed consent, and verified patient privacy and that I am speaking with the correct person using two identifiers.  I am private, in my office and the patient is home.   I discussed the limitations, risks, security and privacy concerns of performing an evaluation and management service by telephone and the availability of in person appointments. I also discussed with the patient that there may be a patient responsible charge related to this service. The patient expressed understanding and agreed to proceed.   I discussed the assessment and treatment plan with the patient. The patient was provided an opportunity to ask questions and all were answered. The patient agreed with the plan and demonstrated an understanding of the instructions.   The patient was advised to call back or seek an in-person evaluation if the symptoms worsen or if the condition fails to improve as anticipated.  I provided 15 minutes of non-face-to-face time during this encounter.  HISTORY/CURRENT STATUS: HPI for 6 week med check.   States she really has not been able to tell much difference since we added the Wellbutrin.  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  Energy is about the same.  She does not isolate.  Does not cry easily.  Denies suicidal or homicidal thoughts.  "I think I have felt down just because of everything that is going on in the world.  I think anybody would feel down a little bit."  Anxiety is well controlled with the Xanax.  Takes it as needed.  Patient denies increased energy with decreased need for sleep, no  increased talkativeness, no racing thoughts, no impulsivity or risky behaviors, no increased spending, no increased libido, no grandiosity.  Denies dizziness, syncope, seizures, numbness, tingling, tremor, tics, unsteady gait, slurred speech, confusion. Denies muscle or joint pain, stiffness, or dystonia.  Individual Medical History/ Review of Systems: Changes? :No    Past medications for mental health diagnoses include: Prozac, Wellbutrin, Paxil, Cymbalta ?  Allergies: Patient has no known allergies.  Current Medications:  Current Outpatient Medications:  .  ALPRAZolam (XANAX) 0.5 MG tablet, Take 1 tablet (0.5 mg total) by mouth 2 (two) times daily as needed for anxiety., Disp: 60 tablet, Rfl: 2 .  aspirin 81 MG chewable tablet, Chew 81 mg by mouth daily., Disp: , Rfl:  .  buPROPion (WELLBUTRIN) 75 MG tablet, Take 1 tablet (75 mg total) by mouth 2 (two) times daily., Disp: 60 tablet, Rfl: 1 .  citalopram (CELEXA) 40 MG tablet, Take 1 tablet (40 mg total) by mouth daily., Disp: 30 tablet, Rfl: 5 .  hyoscyamine (LEVSIN, ANASPAZ) 0.125 MG tablet, Take 0.125 mg by mouth every 6 (six) hours as needed., Disp: , Rfl:  .  lamoTRIgine (LAMICTAL) 150 MG tablet, TAKE 1 TABLET BY MOUTH ONCE DAILY, Disp: 90 tablet, Rfl: 0 .  levothyroxine (SYNTHROID, LEVOTHROID) 100 MCG tablet, Take by mouth., Disp: , Rfl:  .  mycophenolate (CELLCEPT) 500 MG tablet, , Disp: , Rfl:  Medication Side Effects: none  Family Medical/ Social History: Changes? No  MENTAL HEALTH EXAM:  There were no vitals taken for this  visit.There is no height or weight on file to calculate BMI.  General Appearance: Unable to assess  Eye Contact:  Unable to assess  Speech:  Clear and Coherent  Volume:  Normal  Mood:  Euthymic  Affect:  Unable to assess  Thought Process:  Goal Directed  Orientation:  Full (Time, Place, and Person)  Thought Content: Logical   Suicidal Thoughts:  No  Homicidal Thoughts:  No  Memory:  WNL   Judgement:  Good  Insight:  Good  Psychomotor Activity:  Unable to assess  Concentration:  Concentration: Good  Recall:  Good  Fund of Knowledge: Good  Language: Good  Assets:  Desire for Improvement  ADL's:  Intact  Cognition: WNL  Prognosis:  Good    DIAGNOSES:    ICD-10-CM   1. Major depressive disorder, recurrent episode, moderate (HCC)  F33.1   2. Generalized anxiety disorder  F41.1     Receiving Psychotherapy: No    RECOMMENDATIONS:  We discussed the fact that it is difficult to know whether this is caused by a biochemical issue versus situational things going on right now.  It is probably a little bit of both.  For now we agreed to make no changes and reassess in 1 month.  Another option would be to increase the Wellbutrin or Lamictal.  She prefers to keep things the same. Continue Xanax 0.5 mg 1 twice daily as needed. Continue Wellbutrin 75 mg 1 every morning and then 1 early afternoon. Continue Celexa 40 mg daily. Continue Lamictal 150 mg daily. Return in approximately 4 weeks.   Melony Overlyeresa Hurst, PA-C   This record has been created using [employer] software.  Chart creation errors have been sought, but may not always have been located and corrected. Such creation errors do not reflect on the standard of medical care.

## 2019-03-01 ENCOUNTER — Other Ambulatory Visit: Payer: Self-pay

## 2019-03-01 ENCOUNTER — Encounter: Payer: Self-pay | Admitting: Physician Assistant

## 2019-03-01 ENCOUNTER — Ambulatory Visit (INDEPENDENT_AMBULATORY_CARE_PROVIDER_SITE_OTHER): Payer: BC Managed Care – PPO | Admitting: Physician Assistant

## 2019-03-01 DIAGNOSIS — F331 Major depressive disorder, recurrent, moderate: Secondary | ICD-10-CM

## 2019-03-01 DIAGNOSIS — G2581 Restless legs syndrome: Secondary | ICD-10-CM | POA: Diagnosis not present

## 2019-03-01 DIAGNOSIS — F411 Generalized anxiety disorder: Secondary | ICD-10-CM | POA: Diagnosis not present

## 2019-03-01 MED ORDER — LAMOTRIGINE 100 MG PO TABS: 100.0000 mg | ORAL_TABLET | Freq: Two times a day (BID) | ORAL | 1 refills | Status: DC

## 2019-03-01 NOTE — Progress Notes (Signed)
[facility]  Patient ID: Margaret Briggs,  MRN: [id number]  PCP: Philemon KingdomProchnau, Caroline, MD  Date of Evaluation: 03/01/2019 Time spent:15 minutes  Chief Complaint:  Chief Complaint    Follow-up     Virtual Visit via Telephone Note  I connected with patient by a video enabled telemedicine application or telephone, with their informed consent, and verified patient privacy and that I am speaking with the correct person using two identifiers.  I am private, in my home and the patient is at work.  I discussed the limitations, risks, security and privacy concerns of performing an evaluation and management service by telephone and the availability of in person appointments. I also discussed with the patient that there may be a patient responsible charge related to this service. The patient expressed understanding and agreed to proceed.   I discussed the assessment and treatment plan with the patient. The patient was provided an opportunity to ask questions and all were answered. The patient agreed with the plan and demonstrated an understanding of the instructions.   The patient was advised to call back or seek an in-person evaluation if the symptoms worsen or if the condition fails to improve as anticipated.  I provided 15 minutes of non-face-to-face time during this encounter.  HISTORY/CURRENT STATUS: HPI For routine med check.   She couldn't tolerate the Wellbutrin.  Even at the low dose, it kept her up at night, or she had a really hard time going to sleep. So she stopped it.   "I do not think I am depressed or anxious.  I do not know.  I just feel overwhelmed.  I have not had a menstrual cycle in 3 years so some of this could just be physiologic as well as mental.  I just know that I am not real motivated to do anything and do not have a lot of energy.  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  It just gets overwhelming sometimes.  I am able to enjoy things but because  of coronavirus pandemic there is not a lot to do anyway.  I do not have any suicidal thoughts or anything like that."  Rarely takes the Xanax.  It helps when she does need it but she only uses it when she gets so overwhelmed that she cannot function like normal.  She sleeps okay most of the time.  She was recently diagnosed with restless leg syndrome by her PCP.  The Requip has really been helpful with that, allowing her to sleep better.  Denies dizziness, syncope, seizures, numbness, tingling, tremor, tics, unsteady gait, slurred speech, confusion. Denies muscle or joint pain, stiffness, or dystonia.  Individual Medical History/ Review of Systems: Changes? :No    Past medications for mental health diagnoses include: Prozac, Wellbutrin, Paxil, Cymbalta ?  Allergies: Patient has no known allergies.  Current Medications:  Current Outpatient Medications:  .  ALPRAZolam (XANAX) 0.5 MG tablet, Take 1 tablet (0.5 mg total) by mouth 2 (two) times daily as needed for anxiety., Disp: 60 tablet, Rfl: 2 .  aspirin 81 MG chewable tablet, Chew 81 mg by mouth daily., Disp: , Rfl:  .  citalopram (CELEXA) 40 MG tablet, Take 1 tablet (40 mg total) by mouth daily., Disp: 30 tablet, Rfl: 5 .  hyoscyamine (LEVSIN, ANASPAZ) 0.125 MG tablet, Take 0.125 mg by mouth every 6 (six) hours as needed., Disp: , Rfl:  .  levothyroxine (SYNTHROID, LEVOTHROID) 100 MCG tablet, Take by mouth., Disp: ,  Rfl:  .  mycophenolate (CELLCEPT) 500 MG tablet, , Disp: , Rfl:  .  rOPINIRole (REQUIP) 0.25 MG tablet, TAKE 1 TAB BY MOUTH DAILY FOR 2 DAYS THEN TAKE 2 TABS 1 3 HOURS BEFORE BEDTIME ONCE DAILY, Disp: , Rfl:  .  lamoTRIgine (LAMICTAL) 100 MG tablet, Take 1 tablet (100 mg total) by mouth 2 (two) times daily., Disp: 60 tablet, Rfl: 1 Medication Side Effects: none  Family Medical/ Social History: Changes? No  MENTAL HEALTH EXAM:  There were no vitals taken for this visit.There is no height or weight on file to calculate BMI.   General Appearance: Unable to assess  Eye Contact:  Unable to assess  Speech:  Clear and Coherent  Volume:  Normal  Mood:  Euthymic  Affect:  Unable to assess  Thought Process:  Goal Directed  Orientation:  Full (Time, Place, and Person)  Thought Content: Logical   Suicidal Thoughts:  No  Homicidal Thoughts:  No  Memory:  WNL  Judgement:  Good  Insight:  Good  Psychomotor Activity:  Unable to assess  Concentration:  Concentration: Good  Recall:  Good  Fund of Knowledge: Good  Language: Good  Assets:  Desire for Improvement  ADL's:  Intact  Cognition: WNL  Prognosis:  Good    DIAGNOSES:    ICD-10-CM   1. Major depressive disorder, recurrent episode, moderate (HCC)  F33.1   2. Generalized anxiety disorder  F41.1   3. Restless leg syndrome  G25.81     Receiving Psychotherapy: No    RECOMMENDATIONS:  Increase Lamictal to 100 mg bid.  If the morning dose causes sedation, then take both pills at hs.  Continue Xanax 0.5mg  1 po bid prn.  D/C Wellbutrin. Cont Celexa 40 mg po qd. Cont Ropinerole 0.25 mg qhs.  Per PCP. Commend counseling. Return in 4 to 6 weeks.  Donnal Moat, PA-C   This record has been created using [employer].  Chart creation errors have been sought, but may not always have been located and corrected. Such creation errors do not reflect on the standard of medical care.

## 2019-05-07 ENCOUNTER — Encounter: Payer: Self-pay | Admitting: Physician Assistant

## 2019-05-07 ENCOUNTER — Ambulatory Visit (INDEPENDENT_AMBULATORY_CARE_PROVIDER_SITE_OTHER): Payer: BC Managed Care – PPO | Admitting: Physician Assistant

## 2019-05-07 ENCOUNTER — Other Ambulatory Visit: Payer: Self-pay

## 2019-05-07 DIAGNOSIS — G2581 Restless legs syndrome: Secondary | ICD-10-CM | POA: Diagnosis not present

## 2019-05-07 DIAGNOSIS — F331 Major depressive disorder, recurrent, moderate: Secondary | ICD-10-CM | POA: Diagnosis not present

## 2019-05-07 DIAGNOSIS — F411 Generalized anxiety disorder: Secondary | ICD-10-CM

## 2019-05-07 MED ORDER — LAMOTRIGINE 100 MG PO TABS: ORAL_TABLET | ORAL | 1 refills | Status: DC

## 2019-05-07 NOTE — Progress Notes (Signed)
[facility]  Patient ID: Margaret Briggs,  MRN: [id number]  PCP: Philemon Kingdom, MD  Date of Evaluation: 05/07/2019 Time spent:15 minutes  Chief Complaint:  Chief Complaint    Anxiety; Depression     Virtual Visit via Telephone Note  I connected with patient by a video enabled telemedicine application or telephone, with their informed consent, and verified patient privacy and that I am speaking with the correct person using two identifiers.  I am private, in my office and the patient is at work.  I discussed the limitations, risks, security and privacy concerns of performing an evaluation and management service by telephone and the availability of in person appointments. I also discussed with the patient that there may be a patient responsible charge related to this service. The patient expressed understanding and agreed to proceed.   I discussed the assessment and treatment plan with the patient. The patient was provided an opportunity to ask questions and all were answered. The patient agreed with the plan and demonstrated an understanding of the instructions.   The patient was advised to call back or seek an in-person evaluation if the symptoms worsen or if the condition fails to improve as anticipated.  I provided 15 minutes of non-face-to-face time during this encounter.  HISTORY/CURRENT STATUS: HPI for routine med check.  At her last visit 2 months ago, we increased the Lamictal and changed it from once daily dosing to twice daily.  She states she is feeling quite a bit better "but I think I could feel even better than I am right now."  She is not as sluggish as she was.  She is not sleeping a lot or staying in bed a lot to escape.  She does enjoy things more than she was.  Energy and motivation levels are better.  Not isolating.  Denies suicidal or homicidal thoughts.  Anxiety is well controlled.  She sleeps well.  Work is going fine.  No restlessness of her  legs now on the ropinirole.  Patient denies increased energy with decreased need for sleep, no increased talkativeness, no racing thoughts, no impulsivity or risky behaviors, no increased spending, no increased libido, no grandiosity.  Denies dizziness, syncope, seizures, numbness, tingling, tremor, tics, unsteady gait, slurred speech, confusion. Denies muscle or joint pain, stiffness, or dystonia.  Individual Medical History/ Review of Systems: Changes? :No    Past medications for mental health diagnoses include: Prozac, Wellbutrin, Paxil, Cymbalta ?  Allergies: Patient has no known allergies.  Current Medications:  Current Outpatient Medications:  .  ALPRAZolam (XANAX) 0.5 MG tablet, Take 1 tablet (0.5 mg total) by mouth 2 (two) times daily as needed for anxiety., Disp: 60 tablet, Rfl: 2 .  aspirin 81 MG chewable tablet, Chew 81 mg by mouth daily., Disp: , Rfl:  .  citalopram (CELEXA) 40 MG tablet, Take 1 tablet (40 mg total) by mouth daily., Disp: 30 tablet, Rfl: 5 .  hyoscyamine (LEVSIN, ANASPAZ) 0.125 MG tablet, Take 0.125 mg by mouth every 6 (six) hours as needed., Disp: , Rfl:  .  rOPINIRole (REQUIP) 0.25 MG tablet, TAKE 1 TAB BY MOUTH DAILY FOR 2 DAYS THEN TAKE 2 TABS 1 3 HOURS BEFORE BEDTIME ONCE DAILY, Disp: , Rfl:  .  lamoTRIgine (LAMICTAL) 100 MG tablet, 1.5 pills po q am, and 1 pill qhs for 2 weeks, then increase to 1.5 pills bid., Disp: 90 tablet, Rfl: 1 .  levothyroxine (SYNTHROID, LEVOTHROID) 100 MCG tablet, Take by mouth., Disp: , Rfl:  .  mycophenolate (CELLCEPT) 500 MG tablet, , Disp: , Rfl:  Medication Side Effects: none  Family Medical/ Social History: Changes? No  MENTAL HEALTH EXAM:  There were no vitals taken for this visit.There is no height or weight on file to calculate BMI.  General Appearance: unable to assess  Eye Contact:  Unable to assess  Speech:  Clear and Coherent  Volume:  Normal  Mood:  Euthymic  Affect:  Unable to assess  Thought Process:   Goal Directed and Descriptions of Associations: Intact  Orientation:  Full (Time, Place, and Person)  Thought Content: Logical   Suicidal Thoughts:  No  Homicidal Thoughts:  No  Memory:  WNL  Judgement:  Good  Insight:  Good  Psychomotor Activity:  Unable to assess  Concentration:  Concentration: Good  Recall:  Good  Fund of Knowledge: Good  Language: Good  Assets:  Desire for Improvement  ADL's:  Intact  Cognition: WNL  Prognosis:  Good    DIAGNOSES:    ICD-10-CM   1. Major depressive disorder, recurrent episode, moderate (HCC)  F33.1   2. Generalized anxiety disorder  F41.1   3. Restless leg syndrome  G25.81     Receiving Psychotherapy: No    RECOMMENDATIONS:  I am glad she is doing better! Increase Lamictal 100 mg to 1.5 pills every morning and continue 1 pill nightly for 2 weeks.  If she is doing a lot better, she can stay at that dose.  However she can increase to 1.5 pills twice daily after 2 weeks if she is feeling no difference.  She verbalizes understanding. Continue Xanax 0.5 mg, 1 twice daily as needed. Continue Celexa 40 mg daily. Continue ropinirole 0.25 mg, 2 p.o. q. Evening. Return in 6 weeks.  Donnal Moat, PA-C

## 2019-05-21 ENCOUNTER — Ambulatory Visit: Payer: BC Managed Care – PPO | Admitting: Physician Assistant

## 2019-06-16 ENCOUNTER — Ambulatory Visit: Payer: BC Managed Care – PPO | Admitting: Sports Medicine

## 2019-06-16 ENCOUNTER — Other Ambulatory Visit: Payer: Self-pay | Admitting: Sports Medicine

## 2019-06-16 ENCOUNTER — Encounter: Payer: Self-pay | Admitting: Sports Medicine

## 2019-06-16 ENCOUNTER — Other Ambulatory Visit: Payer: Self-pay

## 2019-06-16 DIAGNOSIS — M792 Neuralgia and neuritis, unspecified: Secondary | ICD-10-CM

## 2019-06-16 DIAGNOSIS — M79671 Pain in right foot: Secondary | ICD-10-CM

## 2019-06-16 NOTE — Progress Notes (Signed)
Subjective: Margaret Briggs is a 53 y.o. female patient who presents to office for evaluation of right foot pain. Patient reports a few weeks ago she was having pain but saw PCP who started her on gabapentin and has not had pain only little twinges every now and again but nothing has hurt like it was initially initially the pain was 10 out of 10 sharp worse at nighttime that was on the medial side of the foot would radiate to her toes across the top of her foot reports that she only had pain at night nothing with activity with pressure or use reports that she also has swelling at that time but now everything has been much better since she started on gabapentin of which she has been on for almost 2 weeks and that is when the pain stopped.  Patient also reports that she had x-rays done by her primary care doctor and was referred here for additional follow-up but has not had any issues with any major pain.  Patient denies injury/trip/fall/sprain/any causative factors.  Patient denies a history of back problems.  Review of Systems  All other systems reviewed and are negative.    Patient Active Problem List   Diagnosis Date Noted  . Branch retinal artery occlusion of both eyes 11/27/2015  . Nuclear sclerotic cataract of both eyes 11/27/2015  . Sector visual field defect of both eyes 11/27/2015  . 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 Cerebrovascular accident (CVA) ([place]) 05/03/2015  . Acquired hypothyroidism 01/31/2015  . Bipolar I disorder, most recent episode depressed ([place]) 01/31/2015  . Alteration in bowel elimination: incontinence 01/31/2015  . Cerebrovascular accident, old 01/31/2015  . Absence of bladder continence 01/31/2015    Current Outpatient Medications on File Prior to Visit  Medication Sig Dispense Refill  . aspirin 81 MG chewable tablet Chew 81 mg by mouth daily.    . citalopram (CELEXA) 40 MG tablet Take 1 tablet (40 mg total) by mouth daily. 30 tablet 5  . gabapentin (NEURONTIN) 300 MG  capsule Take 300-600 mg by mouth at bedtime.    . lamoTRIgine (LAMICTAL) 100 MG tablet 1.5 pills po q am, and 1 pill qhs for 2 weeks, then increase to 1.5 pills bid. 90 tablet 1  . levothyroxine (SYNTHROID, LEVOTHROID) 100 MCG tablet Take by mouth.    Marland Kitchen rOPINIRole (REQUIP) 0.25 MG tablet TAKE 1 TAB BY MOUTH DAILY FOR 2 DAYS THEN TAKE 2 TABS 1 3 HOURS BEFORE BEDTIME ONCE DAILY     No current facility-administered medications on file prior to visit.     No Known Allergies  Objective:  General: Alert and oriented x3 in no acute distress  Dermatology: No open lesions bilateral lower extremities, no webspace macerations, no ecchymosis bilateral, all nails x 10 are well manicured.  Vascular: Dorsalis Pedis and Posterior Tibial pedal pulses palpable, Capillary Fill Time 3 seconds,(+) pedal hair growth bilateral, no edema bilateral lower extremities, Temperature gradient within normal limits.  Neurology: Gross sensation intact via light touch bilateral, subjective burning to toes and medial right foot that now has resolved  Musculoskeletal: No reproducible symptoms, no acute bony findings. Strength within normal limits in all groups bilateral.   Gait: Non-antalgic gait  Assessment and Plan: Problem List Items Addressed This Visit    None    Visit Diagnoses    Neuritis    -  Primary   Foot pain, right           -Complete examination performed -Xrays reviewed from [facility]  which were negative -Discussed treatment options for foot pain likely related to neuritis that is now gone -Continue with gabapentin as prescribed by PCP -Continue with good supportive shoes and close monitoring -Advised patient to return to office if symptoms fail to improve or worsen -Patient to return to office as needed or sooner if condition worsens.  Asencion Islam, DPM

## 2019-06-27 ENCOUNTER — Other Ambulatory Visit: Payer: Self-pay | Admitting: Physician Assistant

## 2019-06-27 NOTE — Telephone Encounter (Signed)
Can you check her dose and also remind her to set up apt.

## 2019-06-28 NOTE — Telephone Encounter (Signed)
She stated it is 100 mg BID. I transferred her up front to make her appt. As well.

## 2019-08-04 ENCOUNTER — Ambulatory Visit: Payer: BC Managed Care – PPO | Admitting: Physician Assistant

## 2019-09-19 ENCOUNTER — Other Ambulatory Visit: Payer: Self-pay | Admitting: Physician Assistant

## 2019-09-22 ENCOUNTER — Encounter: Payer: Self-pay | Admitting: Physician Assistant

## 2019-09-22 ENCOUNTER — Ambulatory Visit (INDEPENDENT_AMBULATORY_CARE_PROVIDER_SITE_OTHER): Payer: BC Managed Care – PPO | Admitting: Physician Assistant

## 2019-09-22 ENCOUNTER — Other Ambulatory Visit: Payer: Self-pay

## 2019-09-22 DIAGNOSIS — F411 Generalized anxiety disorder: Secondary | ICD-10-CM | POA: Diagnosis not present

## 2019-09-22 DIAGNOSIS — F3341 Major depressive disorder, recurrent, in partial remission: Secondary | ICD-10-CM | POA: Diagnosis not present

## 2019-09-22 DIAGNOSIS — G2581 Restless legs syndrome: Secondary | ICD-10-CM

## 2019-09-22 MED ORDER — LAMOTRIGINE 200 MG PO TABS: 200.0000 mg | ORAL_TABLET | Freq: Two times a day (BID) | ORAL | 1 refills | Status: DC

## 2019-09-22 MED ORDER — CITALOPRAM HYDROBROMIDE 40 MG PO TABS: 40.0000 mg | ORAL_TABLET | Freq: Every day | ORAL | 1 refills | Status: DC

## 2019-09-22 NOTE — Progress Notes (Signed)
[facility]  Patient ID: Margaret Briggs,  MRN: [id number]  PCP: Philemon Kingdom, MD  Date of Evaluation: 09/22/2019 Time spent:20 minutes  Chief Complaint:  Chief Complaint    Depression      HISTORY/CURRENT STATUS: HPI For 6 month med check.  Feels that her medicines are working well overall.  It has been a stressful year.  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  But "it is all good."  Patient denies loss of interest in usual activities and is able to enjoy things.  Denies decreased energy or motivation.  Appetite has not changed.  No extreme sadness, tearfulness, or feelings of hopelessness.  At times when she has had a bad day or something, she can feel sad.  Also her youngest daughter got married in November and even though that is good, she is now empty nesting which makes her sad sometimes.  Denies suicidal or homicidal thoughts.  Patient denies increased energy with decreased need for sleep, no increased talkativeness, no racing thoughts, no impulsivity or risky behaviors, no increased spending, no increased libido, no grandiosity.  She increased the Lamictal on her own approximately 5 months ago.  Stated that the pills were hard to cut.  She did not have a rash or any side effects.  She has been feeling better since increasing that.  She will occasionally get anxious but is usually triggered by something and goes away pretty quickly.  Restless leg symptoms have resolved been on the ropinirole and gabapentin.  Denies dizziness, syncope, seizures, numbness, tingling, tremor, tics, unsteady gait, slurred speech, confusion. Denies muscle or joint pain, stiffness, or dystonia.  Individual Medical History/ Review of Systems: Changes? :No    Past medications for mental health diagnoses include: Prozac, Wellbutrin, Paxil, Cymbalta ?  Allergies: Patient has no known allergies.  Current Medications:  Current  Outpatient Medications:  .  citalopram (CELEXA) 40 MG tablet, Take 1 tablet (40 mg total) by mouth daily., Disp: 90 tablet, Rfl: 1 .  gabapentin (NEURONTIN) 300 MG capsule, Take 300-600 mg by mouth at bedtime., Disp: , Rfl:  .  levothyroxine (SYNTHROID, LEVOTHROID) 100 MCG tablet, Take by mouth., Disp: , Rfl:  .  rOPINIRole (REQUIP) 0.25 MG tablet, TAKE 1 TAB BY MOUTH DAILY FOR 2 DAYS THEN TAKE 2 TABS 1 3 HOURS BEFORE BEDTIME ONCE DAILY, Disp: , Rfl:  .  aspirin 81 MG chewable tablet, Chew 81 mg by mouth daily., Disp: , Rfl:  .  lamoTRIgine (LAMICTAL) 200 MG tablet, Take 1 tablet (200 mg total) by mouth 2 (two) times daily., Disp: 180 tablet, Rfl: 1 Medication Side Effects: none  Family Medical/ Social History: Changes? No  MENTAL HEALTH EXAM:  There were no vitals taken for this visit.There is no height or weight on file to calculate BMI.  General Appearance: Casual, Neat, Well Groomed and Obese  Eye Contact:  Good  Speech:  Clear and Coherent and Normal Rate  Volume:  Normal  Mood:  Euthymic  Affect:  Appropriate  Thought Process:  Goal Directed and Descriptions of Associations: Intact  Orientation:  Full (Time, Place, and Person)  Thought Content: Logical   Suicidal Thoughts:  No  Homicidal Thoughts:  No  Memory:  WNL  Judgement:  Good  Insight:  Good  Psychomotor Activity:  Normal  Concentration:  Concentration: Good  Recall:  Good  Fund of Knowledge: Good  Language: Good  Assets:  Desire for Improvement  ADL's:  Intact  Cognition: WNL  Prognosis:  Good    DIAGNOSES:    ICD-10-CM   1. Recurrent major depressive disorder, in partial remission ([place])  F33.41   2. Generalized anxiety disorder  F41.1   3. Restless leg syndrome  G25.81     Receiving Psychotherapy: No Is planning to start therapy soon   RECOMMENDATIONS:  PDMP reviewed.  I spent 20 minutes with her. Continue Celexa 40 mg p.o. daily. Continue gabapentin 300 to 600 mg nightly. Continue Lamictal 200 mg,  1 p.o. twice daily. Continue ropinirole 0.25 mg, 2 p.o. q. evening before bed. Reminded her not to make any changes in medication doses without talking with me first.  She is on the highest dose of Lamictal that we typically recommend. I am glad she will be seeing a counselor soon. Return in 6 months.  Donnal Moat, PA-C

## 2019-11-01 ENCOUNTER — Other Ambulatory Visit: Payer: Self-pay | Admitting: Physician Assistant

## 2020-03-22 ENCOUNTER — Other Ambulatory Visit: Payer: Self-pay

## 2020-03-22 ENCOUNTER — Ambulatory Visit (INDEPENDENT_AMBULATORY_CARE_PROVIDER_SITE_OTHER): Payer: BC Managed Care – PPO | Admitting: Physician Assistant

## 2020-03-22 ENCOUNTER — Encounter: Payer: Self-pay | Admitting: Physician Assistant

## 2020-03-22 DIAGNOSIS — F411 Generalized anxiety disorder: Secondary | ICD-10-CM

## 2020-03-22 DIAGNOSIS — G2581 Restless legs syndrome: Secondary | ICD-10-CM | POA: Diagnosis not present

## 2020-03-22 DIAGNOSIS — F3341 Major depressive disorder, recurrent, in partial remission: Secondary | ICD-10-CM

## 2020-03-22 NOTE — Progress Notes (Signed)
[facility]  Patient ID: Margaret Briggs,  MRN: [id number]  PCP: Philemon Kingdom, MD  Date of Evaluation: 03/22/2020 Time spent:20 minutes  Chief Complaint:  Chief Complaint    Depression      HISTORY/CURRENT STATUS: HPI For 6 month med check.  Doing well. Meds are working fine. Able to enjoy things.  Energy and motivation are fair to good.  She does find herself more tired after she gets home from work.  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  Not isolating.  Does not cry easily.  She does get anxious at times, usually triggered by worrying about her adult daughters.  "I just want him to be okay.  And they are.  But as a mama I just worry."  Denies suicidal or homicidal thoughts.  Patient denies increased energy with decreased need for sleep, no increased talkativeness, no racing thoughts, no impulsivity or risky behaviors, no increased spending, no increased libido, no grandiosity.  Restless leg symptoms have improved somewhat on her current medications but not completely.  She will be talking to her PCP concerning this at her next visit in a few weeks.  Denies dizziness, syncope, seizures, numbness, tingling, tremor, tics, unsteady gait, slurred speech, confusion. Denies muscle or joint pain, stiffness, or dystonia.  Individual Medical History/ Review of Systems: Changes? :Yes  right foot achilles tendon issues  Past medications for mental health diagnoses include: Prozac, Wellbutrin, Paxil, Cymbalta ?  Allergies: Patient has no known allergies.  Current Medications:  Current Outpatient Medications:  .  citalopram (CELEXA) 40 MG tablet, Take 1 tablet by mouth once daily, Disp: 90 tablet, Rfl: 2 .  gabapentin (NEURONTIN) 300 MG capsule, Take 300-600 mg by mouth at bedtime., Disp: , Rfl:  .  lamoTRIgine (LAMICTAL) 200 MG tablet, Take 1 tablet (200 mg total) by mouth 2  (two) times daily., Disp: 180 tablet, Rfl: 1 .  aspirin 81 MG chewable tablet, Chew 81 mg by mouth daily. (Patient not taking: Reported on 03/22/2020), Disp: , Rfl:  .  levothyroxine (SYNTHROID, LEVOTHROID) 100 MCG tablet, Take by mouth., Disp: , Rfl:  .  rOPINIRole (REQUIP) 0.25 MG tablet, TAKE 1 TAB BY MOUTH DAILY FOR 2 DAYS THEN TAKE 2 TABS 1 3 HOURS BEFORE BEDTIME ONCE DAILY, Disp: , Rfl:  Medication Side Effects: none  Family Medical/ Social History: Changes? No  MENTAL HEALTH EXAM:  There were no vitals taken for this visit.There is no height or weight on file to calculate BMI.  General Appearance: Casual, Neat, Well Groomed and Obese  Eye Contact:  Good  Speech:  Clear and Coherent and Normal Rate  Volume:  Normal  Mood:  Euthymic  Affect:  Appropriate  Thought Process:  Goal Directed and Descriptions of Associations: Intact  Orientation:  Full (Time, Place, and Person)  Thought Content: Logical   Suicidal Thoughts:  No  Homicidal Thoughts:  No  Memory:  WNL  Judgement:  Good  Insight:  Good  Psychomotor Activity:  Normal  Concentration:  Concentration: Good  Recall:  Good  Fund of Knowledge: Good  Language: Good  Assets:  Desire for Improvement  ADL's:  Intact  Cognition: WNL  Prognosis:  Good    DIAGNOSES:    ICD-10-CM   1. Recurrent major depressive disorder, in partial remission ([facility])  F33.41   2. Generalized anxiety disorder  F41.1  3. Restless leg syndrome  G25.81     Receiving Psychotherapy: Yes    RECOMMENDATIONS:  PDMP reviewed.  I spent 20 minutes with her. I am glad to see her doing well. Continue Celexa 40 mg p.o. daily. Continue gabapentin 300 to 600 mg nightly.  Per another provider Continue Lamictal 200 mg, 1 p.o. twice daily. Continue ropinirole 0.25 mg, 2 p.o. q. evening before bed.  Per another provider Return in 6 months.  Melony Overly, PA-C

## 2020-05-02 ENCOUNTER — Encounter: Payer: Self-pay | Admitting: Gastroenterology

## 2020-05-02 ENCOUNTER — Ambulatory Visit: Payer: BC Managed Care – PPO | Admitting: Gastroenterology

## 2020-05-02 VITALS — BP 130/90 | HR 87 | Ht 68.0 in | Wt 269.0 lb

## 2020-05-02 DIAGNOSIS — K581 Irritable bowel syndrome with constipation: Secondary | ICD-10-CM | POA: Diagnosis not present

## 2020-05-02 DIAGNOSIS — R1013 Epigastric pain: Secondary | ICD-10-CM | POA: Diagnosis not present

## 2020-05-02 DIAGNOSIS — K219 Gastro-esophageal reflux disease without esophagitis: Secondary | ICD-10-CM

## 2020-05-02 DIAGNOSIS — D649 Anemia, unspecified: Secondary | ICD-10-CM | POA: Diagnosis not present

## 2020-05-02 MED ORDER — OMEPRAZOLE 40 MG PO CPDR: 40.0000 mg | DELAYED_RELEASE_CAPSULE | Freq: Every day | ORAL | 11 refills | Status: DC

## 2020-05-02 NOTE — Progress Notes (Signed)
Chief Complaint: FU  Referring Provider:  Philemon Kingdom, MD      ASSESSMENT AND PLAN;   #1.  GERD with epi pain  #2.  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  H/O profound anemia ?  Etiology.  No overt GI bleeding.  Records awaited.  Plan: -Pl get blood work report from Dr Sudie Bailey (done 2 weeks ago) and her last note. -Omeprazole 40mg  po qd #30, 11 refills -EGD with SB bx for further evaluation -Benefiber 1tbs BID -Continue miralax 17g po qd. -If still with problems and the above WU is negative, proceed with followed by CT scan abdo/pelvis, if needed.     HPI:    Margaret Briggs is a 54 y.o. female  Teaches fifth grade  With epigastric discomfort and reflux x 1 year, which has been progressively getting worse.  She also has regurgitation.  No odynophagia or dysphagia.  She went to give blood.  Was found to have very low hemoglobin.  She thereafter had to undergo blood transfusion at [facility].  She was craving ice prior.  Her most recent hemoglobin 2 weeks ago was much better.  She continues to be on iron supplements.  She did not have any overt GI bleeding.  Her CellCept has been stopped.  He continues to be on as needed nonsteroidals.  Occ constipation 1-2/week, with occ pellet like stools then "explosive diarrhea" due to IBS x over 20 years. With a lot of mucus but no blood No nocturnal symptoms.   Past GI history: -Had colonoscopy 02/06/2018: Internal hemorrhoids, otherwise normal colonoscopy to TI.  Repeat in 10 years.  Earlier, if with any new problems or change in family history.   Past Medical History:  Diagnosis Date  . Anxiety and depression   . Depression   . Hypothyroid   . IBS (irritable bowel syndrome)   . Obesity   . Stroke ([facility])   . Susac's syndrome     Past Surgical History:  Procedure Laterality Date  . COLONOSCOPY  06/29/2007   Minimal eryhtema of tje rectum questionavle inmportance up to 10 cm. Small internal  hemorrhoids.   07/01/2007 VAGINA SURGERY  2016   vaginal wall repair    Family History  Problem Relation Age of Onset  . Breast cancer Mother        Died at age 78  . Osteoporosis Mother   . Rheum arthritis Mother   . Irritable bowel syndrome Father   . Colon cancer Neg Hx     Social History   Tobacco Use  . Smoking status: Never Smoker  . Smokeless tobacco: Never Used  Vaping Use  . Vaping Use: Never used  Substance Use Topics  . Alcohol use: No    Alcohol/week: 0.0 standard drinks  . Drug use: No    Current Outpatient Medications  Medication Sig Dispense Refill  . citalopram (CELEXA) 40 MG tablet Take 1 tablet by mouth once daily 90 tablet 2  . Ferrous Sulfate (IRON SUPPLEMENT PO) Take 1 tablet by mouth daily.    . hyoscyamine (LEVSIN SL) 0.125 MG SL tablet Place 1 tablet under the tongue as needed.    . lamoTRIgine (LAMICTAL) 200 MG tablet Take 1 tablet (200 mg total) by mouth 2 (two) times daily. 180 tablet 1  . levothyroxine (SYNTHROID, LEVOTHROID) 100 MCG tablet Take by mouth.    46 OZEMPIC, 0.25 OR 0.5 MG/DOSE, 2 MG/1.5ML SOPN Inject into the skin once a  week. Friday    . rOPINIRole (REQUIP) 1 MG tablet Take 1 mg by mouth at bedtime.    . meloxicam (MOBIC) 15 MG tablet Take 15 mg by mouth daily. (Patient not taking: Reported on 05/02/2020)     No current facility-administered medications for this visit.    No Known Allergies  Review of Systems:  Constitutional: Denies fever, chills, diaphoresis, appetite change and fatigue.  HEENT: Denies photophobia, eye pain, redness, hearing loss, ear pain, congestion, sore throat, rhinorrhea, sneezing, mouth sores, neck pain, neck stiffness and tinnitus.   Respiratory: Denies SOB, DOE, cough, chest tightness,  and wheezing.   Cardiovascular: Denies chest pain, palpitations and leg swelling.  Genitourinary: Denies dysuria, urgency, frequency, hematuria, flank pain and difficulty urinating.  Musculoskeletal: Denies myalgias, back  pain, joint swelling, arthralgias and gait problem.  Skin: No rash.  Neurological: Denies dizziness, seizures, syncope, weakness, light-headedness, numbness and headaches.  Hematological: Denies adenopathy. Easy bruising, personal or family bleeding history  Psychiatric/Behavioral: Has anxiety or depression     Physical Exam:    BP 130/90   Pulse 87   Ht 5\' 8"  (1.727 m)   Wt 269 lb (122 kg)   BMI 40.90 kg/m  Filed Weights   05/02/20 1547  Weight: 269 lb (122 kg)   Constitutional:  Well-developed, in no acute distress. Psychiatric: Normal mood and affect. Behavior is normal. HEENT: Pupils normal.  Conjunctivae are normal. No scleral icterus. Cardiovascular: Normal rate, regular rhythm. No edema Pulmonary/chest: Effort normal and breath sounds normal. No wheezing, rales or rhonchi. Abdominal: Soft, nondistended. Nontender. ? Small supraumblical hernia, Bowel sounds active throughout. There are no masses palpable. No hepatomegaly. Rectal:  defered Neurological: Alert and oriented to person place and time. Skin: Skin is warm and dry. No rashes noted.    05/04/20, MD 05/02/2020, 4:02 PM  Cc: 05/04/2020, MD

## 2020-05-02 NOTE — Patient Instructions (Signed)
If you are age 52 or older, your body mass index should be between 23-30. Your Body mass index is 40.9 kg/m. If this is out of the aforementioned range listed, please consider follow up with your Primary Care Provider.  If you are age 58 or younger, your body mass index should be between 19-25. Your Body mass index is 40.9 kg/m. If this is out of the aformentioned range listed, please consider follow up with your Primary Care Provider.   We have sent the following medications to your pharmacy for you to pick up at your convenience: Omeprazole  You have been scheduled for an endoscopy. Please follow written instructions given to you at your visit today. If you use inhalers (even only as needed), please bring them with you on the day of your procedure.   Please purchase the following medications over the counter and take as directed: Benefiber 1 tablespoon daily.   Thank you,  Dr. Lynann Bologna

## 2020-05-03 ENCOUNTER — Encounter: Payer: Self-pay | Admitting: Gastroenterology

## 2020-05-04 ENCOUNTER — Other Ambulatory Visit: Payer: Self-pay

## 2020-05-04 ENCOUNTER — Ambulatory Visit (AMBULATORY_SURGERY_CENTER): Payer: BC Managed Care – PPO | Admitting: Gastroenterology

## 2020-05-04 ENCOUNTER — Encounter: Payer: Self-pay | Admitting: Gastroenterology

## 2020-05-04 VITALS — BP 145/62 | HR 89 | Temp 97.5°F | Resp 24 | Ht 68.0 in | Wt 269.0 lb

## 2020-05-04 DIAGNOSIS — K449 Diaphragmatic hernia without obstruction or gangrene: Secondary | ICD-10-CM

## 2020-05-04 DIAGNOSIS — K2100 Gastro-esophageal reflux disease with esophagitis, without bleeding: Secondary | ICD-10-CM | POA: Diagnosis not present

## 2020-05-04 DIAGNOSIS — R1013 Epigastric pain: Secondary | ICD-10-CM

## 2020-05-04 DIAGNOSIS — K2950 Unspecified chronic gastritis without bleeding: Secondary | ICD-10-CM

## 2020-05-04 DIAGNOSIS — K2970 Gastritis, unspecified, without bleeding: Secondary | ICD-10-CM | POA: Diagnosis not present

## 2020-05-04 DIAGNOSIS — K219 Gastro-esophageal reflux disease without esophagitis: Secondary | ICD-10-CM

## 2020-05-04 MED ORDER — SODIUM CHLORIDE 0.9 % IV SOLN: 500.0000 mL | Freq: Once | INTRAVENOUS | Status: DC

## 2020-05-04 MED ORDER — OMEPRAZOLE 40 MG PO CPDR: 40.0000 mg | DELAYED_RELEASE_CAPSULE | Freq: Two times a day (BID) | ORAL | 1 refills | Status: DC

## 2020-05-04 NOTE — Progress Notes (Signed)
Pt's states no medical or surgical changes since previsit or office visit. 

## 2020-05-04 NOTE — Op Note (Signed)
[facility] Patient Name: Margaret Briggs Procedure Date: 05/04/2020 3:43 PM MRN: [id number] Endoscopist: Lynann Bologna , MD Age: 54 Referring MD:  Date of Birth: 02-Nov-1965 Gender: Female Account #: [id number] Procedure:                Upper GI endoscopy Indications:              Epigastric abdominal pain. GERD Medicines:                Monitored Anesthesia Care Procedure:                Pre-Anesthesia Assessment:                           - Prior to the procedure, a History and Physical                            was performed, and patient medications and                            allergies were reviewed. The patient's tolerance of                            previous anesthesia was also reviewed. The risks                            and benefits of the procedure and the sedation                            options and risks were discussed with the patient.                            All questions were answered, and informed consent                            was obtained. Prior Anticoagulants: The patient has                            taken no previous anticoagulant or antiplatelet                            agents. ASA Grade Assessment: II - A patient with                            mild systemic disease. After reviewing the risks                            and benefits, the patient was deemed in                            satisfactory condition to undergo the procedure.                           After obtaining informed consent, the endoscope was  passed under direct vision. Throughout the                            procedure, the patient's blood pressure, pulse, and                            oxygen saturations were monitored continuously. The                            Endoscope was introduced through the mouth, and                            advanced to the second part of duodenum. The upper                            GI endoscopy was  accomplished without difficulty.                            The patient tolerated the procedure well. Scope In: Scope Out: Findings:                 LA Grade C (one or more mucosal breaks continuous                            between tops of 2 or more mucosal folds, less than                            75% circumference) esophagitis with no bleeding was                            found 30 to 36 cm from the incisors. Biopsies were                            taken with a cold forceps for histology.                           A 2 cm hiatal hernia was present.                           The entire examined stomach was normal. Biopsies                            were taken with a cold forceps for histology. The                            retroflexed examination of the cardia revealed GE                            junction to be [place] grade III.                           The examined duodenum was normal. Biopsies for  histology were taken with a cold forceps for                            evaluation of celiac disease. Complications:            No immediate complications. Estimated Blood Loss:     Estimated blood loss: none. Impression:               - LA Grade C reflux esophagitis with no bleeding.                            Biopsied.                           - 2 cm hiatal hernia. Recommendation:           - Patient has a contact number available for                            emergencies. The signs and symptoms of potential                            delayed complications were discussed with the                            patient. Return to normal activities tomorrow.                            Written discharge instructions were provided to the                            patient.                           - Resume previous diet.                           - Increase omeprazole to 40 mg p.o. twice daily x                            12 weeks, then decrease it to once a  day.                           - Brochures regarding reflux.                           - Await pathology results.                           - The findings and recommendations were discussed                            with the patient's daughter Lorelle Formosa.                           - Patient is to call us in 2 weeks to let us know  how she is doing. If she still having problems,                            will switch omeprazole to Protonix.                           - FU in 12 weeks. Lynann Bologna, MD 05/04/2020 4:10:05 PM This report has been signed electronically.

## 2020-05-04 NOTE — Progress Notes (Signed)
A and O x3. Report to RN. Tolerated MAC anesthesia well.Teeth unchanged after procedure.

## 2020-05-04 NOTE — Progress Notes (Signed)
Called to room to assist during endoscopic procedure.  Patient ID and intended procedure confirmed with present staff. Received instructions for my participation in the procedure from the performing physician.  

## 2020-05-04 NOTE — Patient Instructions (Addendum)
HANDOUTS PROVIDED ON: ESOPHAGITIS & HIATAL HERNIA  The biopsies taken today have been sent for pathology.  The results can take 1-3 weeks to receive.    You may resume your previous diet and medication schedule.  A prescription for omeprazole (Prilosec) 40 mg twice a day (30 min before breakfast and dinner) has been sent to your pharmacy.  Thank you for allowing [place] to care for you today!!!   YOU HAD AN ENDOSCOPIC PROCEDURE TODAY AT THE [facility]:   Refer to the procedure report that was given to you for any specific questions about what was found during the examination.  If the procedure report does not answer your questions, please call your gastroenterologist to clarify.  If you requested that your care partner not be given the details of your procedure findings, then the procedure report has been included in a sealed envelope for you to review at your convenience later.  YOU SHOULD EXPECT: Some feelings of bloating in the abdomen. Passage of more gas than usual.  Walking can help get rid of the air that was put into your GI tract during the procedure and reduce the bloating.   Please Note:  You might notice some irritation and congestion in your nose or some drainage.  This is from the oxygen used during your procedure.  There is no need for concern and it should clear up in a day or so.  SYMPTOMS TO REPORT IMMEDIATELY:   Following upper endoscopy (EGD)  Vomiting of blood or coffee ground material  New chest pain or pain under the shoulder blades  Painful or persistently difficult swallowing  New shortness of breath  Fever of 100F or higher  Black, tarry-looking stools  For urgent or emergent issues, a gastroenterologist can be reached at any hour by calling [phone]. Do not use MyChart messaging for urgent concerns.    DIET:  We do recommend a small meal at first, but then you may proceed to your regular diet.  Drink plenty of fluids but you should avoid  alcoholic beverages for 24 hours.  ACTIVITY:  You should plan to take it easy for the rest of today and you should NOT DRIVE or use heavy machinery until tomorrow (because of the sedation medicines used during the test).    FOLLOW UP: Our staff will call the number listed on your records Monday morning between 7:15 am and 8:15 am to check on you and address any questions or concerns that you may have regarding the information given to you following your procedure. If we do not reach you, we will leave a message.  We will attempt to reach you two times.  During this call, we will ask if you have developed any symptoms of COVID 19. If you develop any symptoms (ie: fever, flu-like symptoms, shortness of breath, cough etc.) before then, please call [phone].  If you test positive for Covid 19 in the 2 weeks post procedure, please call and report this information to [place].    If any biopsies were taken you will be contacted by phone or by letter within the next 1-3 weeks.  Please call us at [phone] if you have not heard about the biopsies in 3 weeks.    SIGNATURES/CONFIDENTIALITY: You and/or your care partner have signed paperwork which will be entered into your electronic medical record.  These signatures attest to the fact that that the information above on your After Visit Summary has been reviewed and is understood.  Full responsibility of the confidentiality of this discharge information lies with you and/or your care-partner.

## 2020-05-08 ENCOUNTER — Telehealth: Payer: Self-pay

## 2020-05-08 NOTE — Telephone Encounter (Signed)
  Follow up Call-  Call back number 05/04/2020 02/06/2018  Post procedure Call Back phone  # [phone]  Permission to leave phone message Yes Yes  Some recent data might be hidden     Patient questions:  Do you have a fever, pain , or abdominal swelling? No. Pain Score  0 *  Have you tolerated food without any problems? Yes.    Have you been able to return to your normal activities? Yes.    Do you have any questions about your discharge instructions: Diet   No. Medications  No. Follow up visit  No.  Do you have questions or concerns about your Care? No.  Actions: * If pain score is 4 or above: No action needed, pain <4.   1. Have you developed a fever since your procedure? No   2.   Have you had an respiratory symptoms (SOB or cough) since your procedure? No   3.   Have you tested positive for COVID 19 since your procedure? No   4.   Have you had any family members/close contacts diagnosed with the COVID 19 since your procedure?  No    If yes to any of these questions please route to Laverna Peace, RN and Karlton Lemon, RN

## 2020-05-11 ENCOUNTER — Other Ambulatory Visit: Payer: Self-pay | Admitting: Physician Assistant

## 2020-05-14 ENCOUNTER — Encounter: Payer: Self-pay | Admitting: Gastroenterology

## 2020-05-30 DIAGNOSIS — Z6841 Body Mass Index (BMI) 40.0 and over, adult: Secondary | ICD-10-CM | POA: Insufficient documentation

## 2020-05-30 DIAGNOSIS — K439 Ventral hernia without obstruction or gangrene: Secondary | ICD-10-CM | POA: Insufficient documentation

## 2020-06-13 ENCOUNTER — Other Ambulatory Visit: Payer: Self-pay

## 2020-06-13 ENCOUNTER — Encounter: Payer: Self-pay | Admitting: Sports Medicine

## 2020-06-13 ENCOUNTER — Ambulatory Visit: Payer: BC Managed Care – PPO | Admitting: Sports Medicine

## 2020-06-13 DIAGNOSIS — M792 Neuralgia and neuritis, unspecified: Secondary | ICD-10-CM

## 2020-06-13 DIAGNOSIS — M205X1 Other deformities of toe(s) (acquired), right foot: Secondary | ICD-10-CM

## 2020-06-13 DIAGNOSIS — M21619 Bunion of unspecified foot: Secondary | ICD-10-CM

## 2020-06-13 DIAGNOSIS — M79671 Pain in right foot: Secondary | ICD-10-CM

## 2020-06-13 DIAGNOSIS — M779 Enthesopathy, unspecified: Secondary | ICD-10-CM | POA: Diagnosis not present

## 2020-06-13 NOTE — Patient Instructions (Signed)
voltaren topical for foot pain

## 2020-06-13 NOTE — Progress Notes (Signed)
Subjective: Margaret Briggs is a 54 y.o. female patient who returns to office for follow-up evaluation of right foot pain.  Patient reports that is not as painful as it was states that she went to another doctor 2 months ago and had an MRI and x-rays reports that the doctor never touched her feet but put her in a boot for 6 weeks and gave her steroids that did not help and also put her in physical therapy for 1 visit reports that after completing all this treatment the area is not as painful no pain currently at this time but does state that for several weeks he had pain sharp shooting at the big toe joint that was waking her up at night but now this pain has resided and she has been able to sleep at night with no issues.  Patient denies any new problems or other pedal complaints at this time.   Patient Active Problem List   Diagnosis Date Noted  . BMI 40.0-44.9, adult (HCC) 05/30/2020  . Supraumbilical hernia without gangrene and without obstruction 05/30/2020  . Branch retinal artery occlusion of both eyes 11/27/2015  . Nuclear sclerotic cataract of both eyes 11/27/2015  . Sector visual field defect of both eyes 11/27/2015  . 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 Cerebrovascular accident (CVA) (HCC) 05/03/2015  . Acquired hypothyroidism 01/31/2015  . Bipolar I disorder, most recent episode depressed (HCC) 01/31/2015  . Alteration in bowel elimination: incontinence 01/31/2015  . Cerebrovascular accident, old 01/31/2015  . Absence of bladder continence 01/31/2015    Current Outpatient Medications on File Prior to Visit  Medication Sig Dispense Refill  . ALPRAZolam (XANAX) 0.5 MG tablet Take by mouth.    . pregabalin (LYRICA) 75 MG capsule     . aspirin 81 MG EC tablet Take by mouth.    . citalopram (CELEXA) 40 MG tablet Take 1 tablet by mouth once daily 90 tablet 2  . Ferrous Sulfate (IRON SUPPLEMENT PO) Take 1 tablet by mouth daily.    . hyoscyamine (LEVSIN SL) 0.125 MG SL tablet Place 1 tablet under  the tongue as needed.    . lamoTRIgine (LAMICTAL) 200 MG tablet Take 1 tablet (200 mg total) by mouth 2 (two) times daily. 180 tablet 1  . levothyroxine (SYNTHROID, LEVOTHROID) 100 MCG tablet Take by mouth.    . meloxicam (MOBIC) 15 MG tablet Take 15 mg by mouth daily.     Marland Kitchen omeprazole (PRILOSEC) 40 MG capsule Take 1 capsule (40 mg total) by mouth daily. 30 capsule 11  . omeprazole (PRILOSEC) 40 MG capsule Take 1 capsule (40 mg total) by mouth 2 (two) times daily before a meal. 90 capsule 1  . OZEMPIC, 0.25 OR 0.5 MG/DOSE, 2 MG/1.5ML SOPN Inject into the skin once a week. Friday    . rOPINIRole (REQUIP) 1 MG tablet Take 1 mg by mouth at bedtime.     No current facility-administered medications on file prior to visit.    No Known Allergies  Objective:  General: Alert and oriented x3 in no acute distress  Dermatology: No open lesions bilateral lower extremities, no webspace macerations, no ecchymosis bilateral, all nails x 10 are well manicured.  Vascular: Dorsalis Pedis and Posterior Tibial pedal pulses palpable, Capillary Fill Time 3 seconds,(+) pedal hair growth bilateral, no edema bilateral lower extremities, Temperature gradient within normal limits.  Neurology: Gross sensation intact via light touch bilateral, subjective burning to toes and medial right foot that has resolved no longer bothering her at night.  Musculoskeletal: No reproducible symptoms, no acute bony findings. Strength within normal limits in all groups bilateral.   Gait: Non-antalgic gait  MRI Right ankle 03/01/20 at [facility] IMPRESSION: 1. Mild tendinosis of the Achilles tendon with a small interstitial tear and mild subcortical reactive marrow edema at the calcaneal insertion. Severe retrocalcaneal bursitis. 2. Mild tendinosis of the peroneus brevis with a short-segment longitudinal split tear.   Assessment and Plan: Problem List Items Addressed This Visit    None    Visit Diagnoses    Neuritis    -   Primary   Capsulitis       Bunion       Hallux limitus of right foot       Foot pain, right           -Complete examination performed -MRI results reviewed as above -Patient is not currently symptomatic in the areas of concern on MRI -Advised patient to continue with good supportive shoes daily for foot type -Dispensed bunion cushion for patient to use as instructed and advised patient this works well may benefit long-term from custom orthotics -Continue with good supportive shoes and close monitoring -Advised patient if painful may use topical Voltaren -Advised patient to return to office if symptoms fail to improve or worsen and advised patient if symptoms worsen may benefit from a steroid shot -Patient to return to office as needed or sooner if condition worsens.  Asencion Islam, DPM

## 2020-07-13 ENCOUNTER — Telehealth: Payer: Self-pay | Admitting: *Deleted

## 2020-07-13 NOTE — Telephone Encounter (Signed)
-----   Message from Asencion Islam, [place] sent at 07/12/2020  5:27 PM EST ----- The topical that I recommended was Voltaren. It is over the counter Voltaren 1% gel that she can get since her insurance will not cover it. The over the counter version is the same prescription strength version. -Dr. Marylene Land   ----- Message ----- From: Lanney Gins, [facility] Sent: 07/12/2020   4:41 PM EST To: Asencion Islam, DPM  Patient called and stated that she was to get a topical called into the pharmacy and has never got it and was just checking to see what happened. Misty Stanley

## 2020-07-13 NOTE — Telephone Encounter (Signed)
Called and left a message for the patient and relayed the message per Dr Stover. Margaret Briggs ?

## 2020-09-19 ENCOUNTER — Telehealth (INDEPENDENT_AMBULATORY_CARE_PROVIDER_SITE_OTHER): Payer: BC Managed Care – PPO | Admitting: Physician Assistant

## 2020-09-19 ENCOUNTER — Encounter: Payer: Self-pay | Admitting: Physician Assistant

## 2020-09-19 DIAGNOSIS — F3341 Major depressive disorder, recurrent, in partial remission: Secondary | ICD-10-CM | POA: Diagnosis not present

## 2020-09-19 DIAGNOSIS — F411 Generalized anxiety disorder: Secondary | ICD-10-CM

## 2020-09-19 DIAGNOSIS — G2581 Restless legs syndrome: Secondary | ICD-10-CM | POA: Diagnosis not present

## 2020-09-19 MED ORDER — LAMOTRIGINE 200 MG PO TABS: 200.0000 mg | ORAL_TABLET | Freq: Two times a day (BID) | ORAL | 3 refills | Status: DC

## 2020-09-19 MED ORDER — CITALOPRAM HYDROBROMIDE 40 MG PO TABS: 40.0000 mg | ORAL_TABLET | Freq: Every day | ORAL | 3 refills | Status: DC

## 2020-09-19 NOTE — Progress Notes (Signed)
[facility] Check  Patient ID: Margaret Briggs,  MRN: [id number]  PCP: Philemon Kingdom, MD  Date of Evaluation: 09/19/2020 Time spent:25 minutes  Chief Complaint:  Chief Complaint    Anxiety; Depression; Follow-up     Virtual Visit via Telehealth  I connected with patient by a telephone, with their informed consent, and verified patient privacy and that I am speaking with the correct person using two identifiers.  I am private, in my office and the patient is at work.  I discussed the limitations, risks, security and privacy concerns of performing an evaluation and management service by telephone and the availability of in person appointments. I also discussed with the patient that there may be a patient responsible charge related to this service. The patient expressed understanding and agreed to proceed.   I discussed the assessment and treatment plan with the patient. The patient was provided an opportunity to ask questions and all were answered. The patient agreed with the plan and demonstrated an understanding of the instructions.   The patient was advised to call back or seek an in-person evaluation if the symptoms worsen or if the condition fails to improve as anticipated.  I provided 25 minutes of non-face-to-face time during this encounter.  HISTORY/CURRENT STATUS: HPI For 6 month med check.  Still has problems enjoying things, but thinks it's a normal everyday life type stuff.  She is retiring in June and is excited about that.  Oldest daughter is pregnant.  Due in July.  She has been concerned about her daughter who is very nervous about becoming a mother.  States a lot of good things are going on.  Denies decreased energy or motivation.  Appetite has not changed.  No extreme sadness, tearfulness, or feelings of hopelessness.   Sleeps well most of the time.  She does have anxiety but does not need to take the Xanax often at all.  Denies suicidal or homicidal  thoughts.  Patient denies increased energy with decreased need for sleep, no increased talkativeness, no racing thoughts, no impulsivity or risky behaviors, no increased spending, no increased libido, no grandiosity, no paranoia, and no hallucinations.  Denies dizziness, syncope, seizures, numbness, tingling, tremor, tics, unsteady gait, slurred speech, confusion. Denies muscle or joint pain, stiffness, or dystonia.  Individual Medical History/ Review of Systems: Changes? :No    Past medications for mental health diagnoses include: Prozac, Wellbutrin, Paxil, Cymbalta ?  Allergies: Patient has no known allergies.  Current Medications:  Current Outpatient Medications:  .  ALPRAZolam (XANAX) 0.5 MG tablet, Take by mouth., Disp: , Rfl:  .  Ferrous Sulfate (IRON SUPPLEMENT PO), Take 1 tablet by mouth daily., Disp: , Rfl:  .  hyoscyamine (LEVSIN SL) 0.125 MG SL tablet, Place 1 tablet under the tongue as needed., Disp: , Rfl:  .  levothyroxine (SYNTHROID, LEVOTHROID) 100 MCG tablet, Take by mouth., Disp: , Rfl:  .  meloxicam (MOBIC) 15 MG tablet, Take 15 mg by mouth daily. , Disp: , Rfl:  .  omeprazole (PRILOSEC) 40 MG capsule, Take 1 capsule (40 mg total) by mouth daily., Disp: 30 capsule, Rfl: 11 .  omeprazole (PRILOSEC) 40 MG capsule, Take 1 capsule (40 mg total) by mouth 2 (two) times daily before a meal., Disp: 90 capsule, Rfl: 1 .  Phentermine-Topiramate (QSYMIA) 7.5-46 MG CP24, Take by mouth., Disp: , Rfl:  .  rOPINIRole (REQUIP) 1 MG tablet, Take 1 mg by mouth at bedtime., Disp: , Rfl:  .  aspirin 81 MG  EC tablet, Take by mouth. (Patient not taking: Reported on 09/19/2020), Disp: , Rfl:  .  citalopram (CELEXA) 40 MG tablet, Take 1 tablet (40 mg total) by mouth daily., Disp: 90 tablet, Rfl: 3 .  lamoTRIgine (LAMICTAL) 200 MG tablet, Take 1 tablet (200 mg total) by mouth 2 (two) times daily., Disp: 180 tablet, Rfl: 3 .  OZEMPIC, 0.25 OR 0.5 MG/DOSE, 2 MG/1.5ML SOPN, Inject into the skin once  a week. Friday (Patient not taking: Reported on 09/19/2020), Disp: , Rfl:  .  pregabalin (LYRICA) 75 MG capsule, , Disp: , Rfl:  Medication Side Effects: none  Family Medical/ Social History: Changes? No  MENTAL HEALTH EXAM:  There were no vitals taken for this visit.There is no height or weight on file to calculate BMI.  General Appearance: Unable to assess  Eye Contact:  Unable to assess  Speech:  Clear and Coherent and Normal Rate  Volume:  Normal  Mood:  Euthymic  Affect:  Unable to assess  Thought Process:  Goal Directed and Descriptions of Associations: Intact  Orientation:  Full (Time, Place, and Person)  Thought Content: Logical   Suicidal Thoughts:  No  Homicidal Thoughts:  No  Memory:  WNL  Judgement:  Good  Insight:  Good  Psychomotor Activity:  Unable to assess  Concentration:  Concentration: Good  Recall:  Good  Fund of Knowledge: Good  Language: Good  Assets:  Desire for Improvement  ADL's:  Intact  Cognition: WNL  Prognosis:  Good    DIAGNOSES:    ICD-10-CM   1. Recurrent major depressive disorder, in partial remission (HCC)  F33.41   2. Generalized anxiety disorder  F41.1   3. Restless leg syndrome  G25.81     Receiving Psychotherapy: Yes    RECOMMENDATIONS:  PDMP reviewed.  I provided 20 minutes of nonface-to-face time during this encounter discussing her response to medications.  She is doing well overall so no changes need to be made. Congratulations on her upcoming retirement and the birth of her first grandchild! Continue Xanax 0.5 mg, 1 p.o. twice daily as needed.  She has an old prescription and will use that if needed but a prescription can be sent in if needed. Continue Celexa 40 mg p.o. daily. Continue gabapentin 300 to 600 mg nightly.  Per another provider Continue Lamictal 200 mg, 1 p.o. twice daily. Continue ropinirole 1 mg. q. evening before bed.  Per another provider Return in 6 months.  Melony Overly, PA-C

## 2020-11-14 ENCOUNTER — Telehealth: Payer: Self-pay

## 2020-11-14 ENCOUNTER — Other Ambulatory Visit: Payer: Self-pay | Admitting: Sports Medicine

## 2020-11-14 MED ORDER — MELOXICAM 15 MG PO TABS: 15.0000 mg | ORAL_TABLET | Freq: Every day | ORAL | 1 refills | Status: DC

## 2020-11-14 NOTE — Telephone Encounter (Signed)
LVM to patient advising of meloxicam RF sent to pharmacy

## 2020-11-14 NOTE — Progress Notes (Signed)
Refilled Mobic °

## 2020-11-14 NOTE — Telephone Encounter (Signed)
Patient called requesting a RF on Rx Meloxicam 15 mg. And if it's something she can continue to take for pain?

## 2021-02-01 ENCOUNTER — Ambulatory Visit: Payer: BC Managed Care – PPO | Admitting: Physician Assistant

## 2021-02-23 ENCOUNTER — Other Ambulatory Visit: Payer: Self-pay

## 2021-02-23 ENCOUNTER — Ambulatory Visit: Payer: BC Managed Care – PPO | Admitting: Sports Medicine

## 2021-02-23 ENCOUNTER — Encounter: Payer: Self-pay | Admitting: Sports Medicine

## 2021-02-23 DIAGNOSIS — M7662 Achilles tendinitis, left leg: Secondary | ICD-10-CM | POA: Diagnosis not present

## 2021-02-23 DIAGNOSIS — M79671 Pain in right foot: Secondary | ICD-10-CM

## 2021-02-23 DIAGNOSIS — M79672 Pain in left foot: Secondary | ICD-10-CM | POA: Diagnosis not present

## 2021-02-23 DIAGNOSIS — M7661 Achilles tendinitis, right leg: Secondary | ICD-10-CM | POA: Diagnosis not present

## 2021-02-23 MED ORDER — TRIAMCINOLONE ACETONIDE 10 MG/ML IJ SUSP
10.0000 mg | Freq: Once | INTRAMUSCULAR | Status: AC
Start: 2021-02-23 — End: 2021-02-23
  Administered 2021-02-23: 10 mg

## 2021-02-23 NOTE — Progress Notes (Signed)
Subjective: Margaret Briggs is a 54 y.o. female patient who presents to office for evaluation of Right> Left heel pain. Patient complains of progressive pain especially over the last several days reports that the other night the pain was really bad in the right Achilles.  Reports that pain radiates up the back of the leg and gets worse with the more walking and standing that she does also state that pain radiates sometimes to to the bottom of the right heel. Patient has tried icing and stretching with no relief in symptoms. Patient denies any other pedal complaints.   Patient Active Problem List   Diagnosis Date Noted   BMI 40.0-44.9, adult (HCC) 05/30/2020   Supraumbilical hernia without gangrene and without obstruction 05/30/2020   Branch retinal artery occlusion of both eyes 11/27/2015   Nuclear sclerotic cataract of both eyes 11/27/2015   Sector visual field defect of both eyes 11/27/2015   Hemianopia 06/28/2015   Cerebrovascular accident (CVA) (HCC) 05/03/2015   Acquired hypothyroidism 01/31/2015   Bipolar I disorder, most recent episode depressed (HCC) 01/31/2015   Alteration in bowel elimination: incontinence 01/31/2015   Cerebrovascular accident, old 01/31/2015   Absence of bladder continence 01/31/2015    Current Outpatient Medications on File Prior to Visit  Medication Sig Dispense Refill   ALPRAZolam (XANAX) 0.5 MG tablet Take by mouth.     aspirin 81 MG EC tablet Take by mouth. (Patient not taking: Reported on 09/19/2020)     citalopram (CELEXA) 40 MG tablet Take 1 tablet (40 mg total) by mouth daily. 90 tablet 3   Ferrous Sulfate (IRON SUPPLEMENT PO) Take 1 tablet by mouth daily.     hyoscyamine (LEVSIN SL) 0.125 MG SL tablet Place 1 tablet under the tongue as needed.     lamoTRIgine (LAMICTAL) 200 MG tablet Take 1 tablet (200 mg total) by mouth 2 (two) times daily. 180 tablet 3   levothyroxine (SYNTHROID, LEVOTHROID) 100 MCG tablet Take by mouth.     meloxicam (MOBIC) 15 MG  tablet Take 1 tablet (15 mg total) by mouth daily. 90 tablet 1   omeprazole (PRILOSEC) 40 MG capsule Take 1 capsule (40 mg total) by mouth daily. 30 capsule 11   omeprazole (PRILOSEC) 40 MG capsule Take 1 capsule (40 mg total) by mouth 2 (two) times daily before a meal. 90 capsule 1   OZEMPIC, 0.25 OR 0.5 MG/DOSE, 2 MG/1.5ML SOPN Inject into the skin once a week. Friday (Patient not taking: Reported on 09/19/2020)     Phentermine-Topiramate (QSYMIA) 7.5-46 MG CP24 Take by mouth.     pregabalin (LYRICA) 75 MG capsule  (Patient not taking: Reported on 09/19/2020)     rOPINIRole (REQUIP) 1 MG tablet Take 1 mg by mouth at bedtime.     No current facility-administered medications on file prior to visit.    No Known Allergies  Objective:  General: Alert and oriented x3 in no acute distress  Dermatology: No open lesions bilateral lower extremities, no webspace macerations, no ecchymosis bilateral, all nails x 10 are well manicured.  Vascular: Dorsalis Pedis and Posterior Tibial pedal pulses 1/4, Capillary Fill Time 3 seconds, + pedal hair growth bilateral, no edema bilateral lower extremities, Temperature gradient within normal limits.  Neurology: Michaell Cowing sensation intact via light touch bilateral..   Musculoskeletal: Mild to moderate tenderness with palpation at insertion of the Achilles on Right>Left, there is calcaneal exostosis with mild soft tissue swelling present and decreased ankle rom with knee extending  vs flexed resembling gastroc  equnius bilateral, The achilles tendon feels intact with no nodularity or palpable dell, Thompson sign negative.   Assessment and Plan: Problem List Items Addressed This Visit   None Visit Diagnoses     Achilles tendinitis, right leg    -  Primary   Achilles tendinitis, left leg       Heel pain, bilateral           -Complete examination performed -MRI reviewed from last year which reveals Achilles tendinosis as well as peroneal tendinosis -Discussed  treatment options -After oral consent and aseptic prep, injected a mixture containing 1 ml of 2% plain lidocaine, 1 ml 0.5% plain marcaine, 0.5 ml of kenalog 10 and 0.5 ml of dexamethasone phosphate into right heel at the Achilles insertion at the medial aspect with care to protect the tendon and not violate it.  Post-injection care discussed with patient.  -Patient to use her cam boot which she has at home for 1 week to further protect and offload tendon especially after injection then after may transition to a good supportive shoe with heel lifts and Achilles sleeves as provided at this visit for patient -Advised patient to use night splint that she has at home and ice as directed every evening -Patient to continue with meloxicam -Patient to return to office in 3 to 4 weeks or sooner if condition worsens.  Asencion Islam, DPM

## 2021-03-16 ENCOUNTER — Ambulatory Visit: Payer: BC Managed Care – PPO | Admitting: Sports Medicine

## 2021-03-17 ENCOUNTER — Other Ambulatory Visit: Payer: Self-pay | Admitting: Gastroenterology

## 2021-04-10 ENCOUNTER — Other Ambulatory Visit: Payer: Self-pay

## 2021-04-10 ENCOUNTER — Ambulatory Visit: Payer: BC Managed Care – PPO | Admitting: Physician Assistant

## 2021-04-10 ENCOUNTER — Encounter: Payer: Self-pay | Admitting: Physician Assistant

## 2021-04-10 DIAGNOSIS — F411 Generalized anxiety disorder: Secondary | ICD-10-CM

## 2021-04-10 DIAGNOSIS — F3341 Major depressive disorder, recurrent, in partial remission: Secondary | ICD-10-CM | POA: Diagnosis not present

## 2021-04-10 DIAGNOSIS — F418 Other specified anxiety disorders: Secondary | ICD-10-CM | POA: Diagnosis not present

## 2021-04-10 DIAGNOSIS — G2581 Restless legs syndrome: Secondary | ICD-10-CM | POA: Diagnosis not present

## 2021-04-10 MED ORDER — ALPRAZOLAM 0.5 MG PO TABS: 0.5000 mg | ORAL_TABLET | Freq: Two times a day (BID) | ORAL | 1 refills | Status: DC | PRN

## 2021-04-10 NOTE — Progress Notes (Deleted)
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  Oldest daughter had a baby, so she was stressed about that, but everything is fine and her grandson is healthy. Other daughter's mental health isn't good. She had surgery on left hand a few weeks back, then an emergency abdominal hernia repaired a few weeks ago. Financially worried since retirement. Youngest daughter lives w/ her. Just overwhelmed.  Not depressed though. Able to enjoy things. Meets friends for coffee, went to concerts in the park over the summer, going to a wedding this weekend. Enjoys going out       Individual Medical History/ Review of Systems: Changes? :{EXAM; YES/NO:21197}  Allergies: Patient has no known allergies.  Current Medications:  Current Outpatient Medications:    citalopram (CELEXA) 40 MG tablet, Take 1 tablet (40 mg total) by mouth daily., Disp: 90 tablet, Rfl: 3   hyoscyamine (LEVSIN SL) 0.125 MG SL tablet, Place 1 tablet under the tongue as needed., Disp: , Rfl:    lamoTRIgine (LAMICTAL) 200 MG tablet, Take 1 tablet (200 mg total) by mouth 2 (two) times daily., Disp: 180 tablet, Rfl: 3   levothyroxine (SYNTHROID, LEVOTHROID) 100 MCG tablet, Take by mouth., Disp: , Rfl:    meloxicam (MOBIC) 15 MG tablet, Take 1 tablet (15 mg total) by mouth daily., Disp: 90 tablet, Rfl: 1   omeprazole (PRILOSEC) 40 MG capsule, Take 1 capsule (40 mg total) by mouth daily., Disp: 30 capsule, Rfl: 11   omeprazole (PRILOSEC) 40 MG capsule, TAKE 1 CAPSULE BY  MOUTH TWICE DAILY BEFORE MEAL(S), Disp: 180 capsule, Rfl: 0   rOPINIRole (REQUIP) 1 MG tablet, Take 1 mg by mouth at bedtime., Disp: , Rfl:    ALPRAZolam (XANAX) 0.5 MG tablet, Take 1 tablet (0.5 mg total) by mouth 2 (two) times daily as needed for anxiety., Disp: 60 tablet, Rfl: 1   aspirin 81 MG EC tablet, Take by mouth. (Patient not taking: No sig reported), Disp: , Rfl:    Ferrous Sulfate (IRON SUPPLEMENT PO), Take 1 tablet by mouth daily. (Patient not taking: Reported on 04/10/2021), Disp: , Rfl:    OZEMPIC, 0.25 OR 0.5 MG/DOSE, 2 MG/1.5ML SOPN, Inject into the skin once a week. Friday (Patient not taking: No sig reported), Disp: , Rfl:    Phentermine-Topiramate (QSYMIA) 7.5-46 MG CP24, Take by mouth. (Patient not taking: Reported on 04/10/2021), Disp: , Rfl:    pregabalin (LYRICA) 75 MG capsule, , Disp: , Rfl:  Medication Side Effects: {Medication Side Effects (Optional):12147}  Family Medical/ Social History: Changes? {EXAM; YES/NO:19492}  MENTAL HEALTH EXAM:  There were no vitals taken for this visit.There is no height or weight on file to calculate BMI.  General Appearance: {PSY:[id number]}  Eye Contact:  {PSY:22684}  Speech:  {[phone]}  Volume:  {PSY:22686}  Mood:  {PSY:22306}  Affect:  {[phone]}  Thought Process:  {PSY:22688}  Orientation:  {PSY:22689}  Thought Content: {PSYt:22690}   Suicidal Thoughts:  {PSY:22692}  Homicidal Thoughts:  {PSY:22692}  Memory:  {[phone]}  Judgement:  {PSY:22694}  Insight:  {  QZR:00762}  Psychomotor Activity:  {PSY:22696}  Concentration:  {PSY:21399}  Recall:  {PSY:22877}  Fund of Knowledge: {PSY:22877}  Language: {UQJ:33545}  Assets:  {PSY:22698}  ADL's:  {PSY:22290}  Cognition: {GYB:[id number]}  Prognosis:  {PSY:22877}    DIAGNOSES: No diagnosis found.  Receiving Psychotherapy: {AJG:81157}   RECOMMENDATIONS:  PDMP reviewed. Oxycodone and Qsymia noted.       Melony Overly, PA-C

## 2021-04-12 NOTE — Progress Notes (Signed)
[facility]  Patient ID: Margaret Briggs,  MRN: [id number]  PCP: Philemon Kingdom, MD  Date of Evaluation: 04/10/2021 Time spent:20 minutes  Chief Complaint:  Chief Complaint   Anxiety; Depression; Follow-up     HISTORY/CURRENT STATUS: HPI for routine 55-month med check.  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  Oldest daughter had a baby, so she was stressed about that, but everything is fine and her grandson is healthy. Other daughter's mental health isn't good. She had surgery on left hand a few weeks back, then an emergency abdominal hernia repaired a few weeks ago. Financially worried since retirement. Youngest daughter lives w/ her. Just overwhelmed.  Not depressed though. Able to enjoy things. Meets friends for coffee, went to concerts in the park over the summer, going to a wedding this weekend. Enjoys going out with friends.  Energy and motivation are good most of the time.  No suicidal or homicidal thoughts.  Patient denies increased energy with decreased need for sleep, no increased talkativeness, no racing thoughts, no impulsivity or risky behaviors, no increased spending, no increased libido, no grandiosity, no increased irritability or anger, and no hallucinations.  Denies dizziness, syncope, seizures, numbness, tingling, tremor, tics, unsteady gait, slurred speech, confusion. Denies muscle or joint pain, stiffness, or dystonia.  Individual Medical History/ Review of Systems: Changes? :Yes  See HPI  Allergies: Patient has no known allergies.  Current Medications:  Current Outpatient Medications:    citalopram (CELEXA) 40 MG tablet, Take 1 tablet (40 mg total) by mouth daily., Disp: 90 tablet, Rfl: 3   hyoscyamine (LEVSIN SL) 0.125 MG SL tablet, Place 1 tablet under  the tongue as needed., Disp: , Rfl:    lamoTRIgine (LAMICTAL) 200 MG tablet, Take 1 tablet (200 mg total) by mouth 2 (two) times daily., Disp: 180 tablet, Rfl: 3   levothyroxine (SYNTHROID, LEVOTHROID) 100 MCG tablet, Take by mouth., Disp: , Rfl:    meloxicam (MOBIC) 15 MG tablet, Take 1 tablet (15 mg total) by mouth daily., Disp: 90 tablet, Rfl: 1   omeprazole (PRILOSEC) 40 MG capsule, Take 1 capsule (40 mg total) by mouth daily., Disp: 30 capsule, Rfl: 11   omeprazole (PRILOSEC) 40 MG capsule, TAKE 1 CAPSULE BY MOUTH TWICE DAILY BEFORE MEAL(S), Disp: 180 capsule, Rfl: 0   rOPINIRole (REQUIP) 1 MG tablet, Take 1 mg by mouth at bedtime., Disp: , Rfl:    ALPRAZolam (XANAX) 0.5 MG tablet, Take 1 tablet (0.5 mg total) by mouth 2 (two) times daily as needed for anxiety., Disp: 60 tablet, Rfl: 1   aspirin 81 MG EC tablet, Take by mouth. (Patient not taking: No sig reported), Disp: , Rfl:    Ferrous Sulfate (IRON SUPPLEMENT PO), Take 1 tablet by mouth daily. (Patient not taking: Reported on 04/10/2021), Disp: , Rfl:    OZEMPIC, 0.25 OR 0.5 MG/DOSE, 2 MG/1.5ML SOPN, Inject into the skin once a week. Friday (Patient not taking: No sig reported), Disp: , Rfl:    Phentermine-Topiramate (QSYMIA) 7.5-46 MG CP24, Take by mouth. (Patient not taking: Reported on 04/10/2021), Disp: , Rfl:    pregabalin (LYRICA) 75 MG capsule, , Disp: , Rfl:  Medication Side Effects: none  Family Medical/ Social History: Changes? Yes see HPI  MENTAL HEALTH EXAM:  There were no vitals taken  for this visit.There is no height or weight on file to calculate BMI.  General Appearance: Casual, Well Groomed, and Obese  Eye Contact:  Good  Speech:  Clear and Coherent and Normal Rate  Volume:  Normal  Mood:  Euthymic  Affect:  Appropriate  Thought Process:  Goal Directed and Descriptions of Associations: Circumstantial  Orientation:  Full (Time, Place, and Person)  Thought Content: Logical   Suicidal Thoughts:  No  Homicidal  Thoughts:  No  Memory:  WNL  Judgement:  Good  Insight:  Good  Psychomotor Activity:  Normal  Concentration:  Concentration: Good  Recall:  Good  Fund of Knowledge: Good  Language: Good  Assets:  Desire for Improvement  ADL's:  Intact  Cognition: WNL  Prognosis:  Good    DIAGNOSES:    ICD-10-CM   1. Recurrent major depressive disorder, in partial remission (HCC)  F33.41     2. Generalized anxiety disorder  F41.1     3. Restless leg syndrome  G25.81     4. Situational anxiety  F41.8       Receiving Psychotherapy: No    RECOMMENDATIONS:  PDMP reviewed. Oxycodone and Qsymia noted.  I provided 20 minutes of face to face time during this encounter, including time spent before and after the visit in records review, medical decision making, and charting.  Congratulations on the birth of her grandson! She is doing well so no changes will be made. Continue Xanax 0.5 mg, 1 p.o. twice daily as needed. Celexa 40 mg, 1 p.o. daily. Continue Lamictal 200 mg, 1 p.o. twice daily. Continue ropinirole 1 mg nightly. Recommend counseling. Return in 6 months.  Melony Overly, PA-C

## 2021-07-13 ENCOUNTER — Other Ambulatory Visit: Payer: Self-pay | Admitting: Sports Medicine

## 2021-07-13 NOTE — Telephone Encounter (Signed)
Please advise 

## 2021-10-09 ENCOUNTER — Ambulatory Visit: Payer: BC Managed Care – PPO | Admitting: Physician Assistant

## 2022-01-02 ENCOUNTER — Other Ambulatory Visit: Payer: Self-pay | Admitting: Gastroenterology

## 2022-01-07 ENCOUNTER — Other Ambulatory Visit: Payer: Self-pay

## 2022-01-07 ENCOUNTER — Telehealth: Payer: Self-pay | Admitting: Physician Assistant

## 2022-01-07 MED ORDER — CITALOPRAM HYDROBROMIDE 40 MG PO TABS: 40.0000 mg | ORAL_TABLET | Freq: Every day | ORAL | 0 refills | Status: DC

## 2022-01-07 NOTE — Telephone Encounter (Signed)
Next visit is 01/16/22. Margaret Briggs is requesting a refill on her Citalopram 40 mg called to:  [facility] [address], [place] - [address]  Phone:  [phone]  Fax:  [phone]

## 2022-01-07 NOTE — Telephone Encounter (Signed)
Rx sent 

## 2022-01-16 ENCOUNTER — Encounter: Payer: Self-pay | Admitting: Physician Assistant

## 2022-01-16 ENCOUNTER — Ambulatory Visit: Payer: BC Managed Care – PPO | Admitting: Physician Assistant

## 2022-01-16 DIAGNOSIS — F411 Generalized anxiety disorder: Secondary | ICD-10-CM | POA: Diagnosis not present

## 2022-01-16 DIAGNOSIS — F331 Major depressive disorder, recurrent, moderate: Secondary | ICD-10-CM

## 2022-01-16 DIAGNOSIS — G2581 Restless legs syndrome: Secondary | ICD-10-CM

## 2022-01-16 MED ORDER — LAMOTRIGINE 200 MG PO TABS: 200.0000 mg | ORAL_TABLET | Freq: Two times a day (BID) | ORAL | 3 refills | Status: DC

## 2022-01-16 MED ORDER — ALPRAZOLAM 0.5 MG PO TABS: 0.5000 mg | ORAL_TABLET | Freq: Two times a day (BID) | ORAL | 1 refills | Status: DC | PRN

## 2022-01-16 MED ORDER — BUPROPION HCL ER (XL) 150 MG PO TB24: 150.0000 mg | ORAL_TABLET | ORAL | 0 refills | Status: DC

## 2022-01-16 MED ORDER — CITALOPRAM HYDROBROMIDE 40 MG PO TABS: 40.0000 mg | ORAL_TABLET | Freq: Every day | ORAL | 3 refills | Status: DC

## 2022-01-16 NOTE — Progress Notes (Signed)
[facility]  Patient ID: Margaret Briggs,  MRN: [id number]  PCP: Philemon Kingdom, MD  Date of Evaluation: 01/16/2022. Time spent:30 minutes  Chief Complaint:  Chief Complaint   Anxiety; Depression; Follow-up     HISTORY/CURRENT STATUS: HPI for routine 45-month med check.  Stressed, her youngest dtr attempted suicide 3 months ago and is in therapy, pt is retired which has been harder than she thought. Her parents health is going downhill. She cries easily, doesn't have any energy, is working at [employer] this summer. But that's all she does. Goes home and sits on couch. Appetite is increased. Eats a lot of easy junk food. Likes to sleep. Anxious when thinking about her dtr, not having PA but more generalized, overwhelmed. Xanax helps. No SI/HI.  Patient denies increased energy with decreased need for sleep, no increased talkativeness, no racing thoughts, no impulsivity or risky behaviors, no increased spending, no increased libido, no grandiosity, no increased irritability or anger, and no hallucinations.  Denies dizziness, syncope, seizures, numbness, tingling, tremor, tics, unsteady gait, slurred speech, confusion. Denies muscle or joint pain, stiffness, or dystonia.  Individual Medical History/ Review of Systems: Changes? :No   Past medications for mental health diagnoses include: Prozac, Wellbutrin, Paxil, Cymbalta, Celexa, Lamictal, Xanax  Allergies: Patient has no known allergies.  Current Medications:  Current Outpatient Medications:    buPROPion (WELLBUTRIN XL) 150 MG 24 hr tablet, Take 1 tablet (150 mg total) by mouth every morning., Disp: 30 tablet, Rfl: 0   levothyroxine (SYNTHROID, LEVOTHROID) 100 MCG tablet, Take by mouth., Disp: , Rfl:    meloxicam (MOBIC) 15 MG tablet, Take 1 tablet by mouth once daily, Disp: 90 tablet, Rfl: 0   omeprazole (PRILOSEC) 40 MG capsule, TAKE 1 CAPSULE BY MOUTH TWICE DAILY BEFORE MEAL(S), Disp: 60 capsule,  Rfl: 0   rOPINIRole (REQUIP) 1 MG tablet, Take 2 mg by mouth at bedtime., Disp: , Rfl:    ALPRAZolam (XANAX) 0.5 MG tablet, Take 1 tablet (0.5 mg total) by mouth 2 (two) times daily as needed for anxiety., Disp: 60 tablet, Rfl: 1   aspirin 81 MG EC tablet, Take by mouth. (Patient not taking: Reported on 09/19/2020), Disp: , Rfl:    citalopram (CELEXA) 40 MG tablet, Take 1 tablet (40 mg total) by mouth daily., Disp: 90 tablet, Rfl: 3   Ferrous Sulfate (IRON SUPPLEMENT PO), Take 1 tablet by mouth daily. (Patient not taking: Reported on 04/10/2021), Disp: , Rfl:    hyoscyamine (LEVSIN SL) 0.125 MG SL tablet, Place 1 tablet under the tongue as needed. (Patient not taking: Reported on 01/16/2022), Disp: , Rfl:    lamoTRIgine (LAMICTAL) 200 MG tablet, Take 1 tablet (200 mg total) by mouth 2 (two) times daily., Disp: 180 tablet, Rfl: 3   OZEMPIC, 0.25 OR 0.5 MG/DOSE, 2 MG/1.5ML SOPN, Inject into the skin once a week. Friday (Patient not taking: Reported on 09/19/2020), Disp: , Rfl:    Phentermine-Topiramate (QSYMIA) 7.5-46 MG CP24, Take by mouth. (Patient not taking: Reported on 04/10/2021), Disp: , Rfl:    pregabalin (LYRICA) 75 MG capsule, , Disp: , Rfl:  Medication Side Effects: none  Family Medical/ Social History: Changes? Yes see HPI  MENTAL HEALTH EXAM:  There were no vitals taken for this visit.There is no height or weight on file to calculate BMI.  General Appearance: Casual, Well Groomed, and Obese  Eye Contact:  Good  Speech:  Clear and Coherent and Normal Rate  Volume:  Normal  Mood:  Depressed  Affect:  Depressed and Tearful  Thought Process:  Goal Directed and Descriptions of Associations: Circumstantial  Orientation:  Full (Time, Place, and Person)  Thought Content: Logical   Suicidal Thoughts:  No  Homicidal Thoughts:  No  Memory:  WNL  Judgement:  Good  Insight:  Good  Psychomotor Activity:  Normal  Concentration:  Concentration: Good  Recall:  Good  Fund of Knowledge: Good   Language: Good  Assets:  Desire for Improvement  ADL's:  Intact  Cognition: WNL  Prognosis:  Good   DIAGNOSES:    ICD-10-CM   1. Major depressive disorder, recurrent episode, moderate (HCC)  F33.1     2. Generalized anxiety disorder  F41.1     3. Restless leg syndrome  G25.81       Receiving Psychotherapy: No   will start at [facility] counseling next month  RECOMMENDATIONS:  PDMP reviewed.  Xanax filled 04/10/2021. I provided 30 minutes of face to face time during this encounter, including time spent before and after the visit in records review, medical decision making, counseling pertinent to today's visit, and charting.   Discussed re-starting Wellbutrin. It did pep her up in the past but caused insomnia if took too late, take as early in day as possible.   Continue Xanax 0.5 mg, 1 p.o. twice daily as needed. Restart Wellbutrin XL 150 mg, 1 p.o. every morning. Continue Celexa 40 mg, 1 p.o. daily. Continue Lamictal 200 mg, 1 p.o. twice daily. Continue ropinirole  mg nightly. Will be starting counseling soon. Return in 4 weeks.  Melony Overly, PA-C

## 2022-02-13 ENCOUNTER — Ambulatory Visit: Payer: BC Managed Care – PPO | Admitting: Physician Assistant

## 2022-02-13 ENCOUNTER — Encounter: Payer: Self-pay | Admitting: Physician Assistant

## 2022-02-13 DIAGNOSIS — G2581 Restless legs syndrome: Secondary | ICD-10-CM | POA: Diagnosis not present

## 2022-02-13 DIAGNOSIS — F3341 Major depressive disorder, recurrent, in partial remission: Secondary | ICD-10-CM

## 2022-02-13 DIAGNOSIS — F411 Generalized anxiety disorder: Secondary | ICD-10-CM | POA: Diagnosis not present

## 2022-02-13 MED ORDER — BUPROPION HCL ER (XL) 150 MG PO TB24: 150.0000 mg | ORAL_TABLET | ORAL | 3 refills | Status: DC

## 2022-02-13 NOTE — Progress Notes (Signed)
[facility]  Patient ID: Margaret Briggs,  MRN: [id number]  PCP: Philemon Kingdom, MD  Date of Evaluation: 02/13/2022 Time spent:20 minutes  Chief Complaint:  Chief Complaint   Depression; Anxiety; Follow-up    HISTORY/CURRENT STATUS: HPI for 1 month follow-up after restarting Wellbutrin.  Doing much better since we restarted Wellbutrin last month.  Not having "meltdowns" for no reason.  She did have a situation when she had to get 4 new tires, her inspection was due on the car.  So that was very stressful and she got teary about that but as far as crying for no reason, she has not.  She has more energy, enjoyed spending time at her grandson's birthday party, is going today to spend some time with her friend and swim.  Her only complaint is still lack of motivation.  When she goes home she sits on the couch and has the TV running in the background, she does not really even watch it.  Hopes to have more motivation soon.  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  States she sleeps well and the RLS is controlled.  Appetite and weight are stable.  ADLs and personal hygiene are normal.  No suicidal or homicidal thoughts.  She does get anxious but forgets about the Xanax.  Looking back, she can tell when she "should have taken it" and that would have made things go better.  Like her grandson's birthday party and her ex-husband and his wife were there.  She is not having panic attacks but does just get overwhelmed.  Denies dizziness, syncope, seizures, numbness, tingling, tremor, tics, unsteady gait, slurred speech, confusion. Denies muscle or joint pain, stiffness, or dystonia.  Individual Medical History/ Review of Systems: Changes? :No   Past medications for mental health diagnoses include: Prozac, Wellbutrin, Paxil, Cymbalta, Celexa, Lamictal, Xanax  Allergies: Patient has no known allergies.  Current  Medications:  Current Outpatient Medications:    ALPRAZolam (XANAX) 0.5 MG tablet, Take 1 tablet (0.5 mg total) by mouth 2 (two) times daily as needed for anxiety., Disp: 60 tablet, Rfl: 1   citalopram (CELEXA) 40 MG tablet, Take 1 tablet (40 mg total) by mouth daily., Disp: 90 tablet, Rfl: 3   lamoTRIgine (LAMICTAL) 200 MG tablet, Take 1 tablet (200 mg total) by mouth 2 (two) times daily., Disp: 180 tablet, Rfl: 3   levothyroxine (SYNTHROID, LEVOTHROID) 100 MCG tablet, Take by mouth., Disp: , Rfl:    meloxicam (MOBIC) 15 MG tablet, Take 1 tablet by mouth once daily, Disp: 90 tablet, Rfl: 0   omeprazole (PRILOSEC) 40 MG capsule, TAKE 1 CAPSULE BY MOUTH TWICE DAILY BEFORE MEAL(S), Disp: 60 capsule, Rfl: 0   rOPINIRole (REQUIP) 1 MG tablet, Take 2 mg by mouth at bedtime., Disp: , Rfl:    aspirin 81 MG EC tablet, Take by mouth. (Patient not taking: Reported on 09/19/2020), Disp: , Rfl:    buPROPion (WELLBUTRIN XL) 150 MG 24 hr tablet, Take 1 tablet (150 mg total) by mouth every morning., Disp: 90 tablet, Rfl: 3   Ferrous Sulfate (IRON SUPPLEMENT PO), Take 1 tablet by mouth daily. (Patient not taking: Reported on 04/10/2021), Disp: , Rfl:    hyoscyamine (LEVSIN SL) 0.125 MG SL tablet, Place 1 tablet under the tongue as needed. (Patient not taking: Reported on 01/16/2022), Disp: , Rfl:    OZEMPIC, 0.25 OR 0.5 MG/DOSE, 2 MG/1.5ML SOPN, Inject into  the skin once a week. Friday (Patient not taking: Reported on 09/19/2020), Disp: , Rfl:    Phentermine-Topiramate (QSYMIA) 7.5-46 MG CP24, Take by mouth. (Patient not taking: Reported on 04/10/2021), Disp: , Rfl:    pregabalin (LYRICA) 75 MG capsule, , Disp: , Rfl:  Medication Side Effects: none  Family Medical/ Social History: Changes? Yes see HPI  MENTAL HEALTH EXAM:  There were no vitals taken for this visit.There is no height or weight on file to calculate BMI.  General Appearance: Casual, Well Groomed, and Obese  Eye Contact:  Good  Speech:  Clear and  Coherent and Normal Rate  Volume:  Normal  Mood:  Depressed  Affect:  Depressed and Tearful  Thought Process:  Goal Directed and Descriptions of Associations: Circumstantial  Orientation:  Full (Time, Place, and Person)  Thought Content: Logical   Suicidal Thoughts:  No  Homicidal Thoughts:  No  Memory:  WNL  Judgement:  Good  Insight:  Good  Psychomotor Activity:  Normal  Concentration:  Concentration: Good  Recall:  Good  Fund of Knowledge: Good  Language: Good  Assets:  Communication Skills Desire for Improvement Financial Resources/Insurance Housing Resilience Transportation Vocational/Educational  ADL's:  Intact  Cognition: WNL  Prognosis:  Good   DIAGNOSES:    ICD-10-CM   1. Recurrent major depressive disorder, in partial remission (HCC)  F33.41     2. Generalized anxiety disorder  F41.1     3. Restless leg syndrome  G25.81      Receiving Psychotherapy: No   will start at [facility] counseling soon  RECOMMENDATIONS:  PDMP reviewed.  Xanax filled 01/16/2022. I provided 20 minutes of face to face time during this encounter, including time spent before and after the visit in records review, medical decision making, counseling pertinent to today's visit, and charting.   I am glad to see her doing better!  We discussed the fact that if motivation is no better within 2 weeks then she can call, will increase the Wellbutrin up to 300 mg p.o. every morning.  Continue Xanax 0.5 mg, 1 p.o. twice daily as needed. Continue Wellbutrin XL 150 mg, 1 p.o. every morning. Continue Celexa 40 mg, 1 p.o. daily. Continue Lamictal 200 mg, 1 p.o. twice daily. Continue ropinirole  mg nightly. Will be starting counseling soon. Return in 6 months.  Melony Overly, PA-C

## 2022-05-07 ENCOUNTER — Encounter: Payer: Self-pay | Admitting: Neurology

## 2022-05-17 ENCOUNTER — Ambulatory Visit (INDEPENDENT_AMBULATORY_CARE_PROVIDER_SITE_OTHER): Payer: BC Managed Care – PPO

## 2022-05-17 ENCOUNTER — Ambulatory Visit: Payer: BC Managed Care – PPO | Admitting: Podiatry

## 2022-05-17 DIAGNOSIS — M778 Other enthesopathies, not elsewhere classified: Secondary | ICD-10-CM | POA: Diagnosis not present

## 2022-05-17 DIAGNOSIS — M7661 Achilles tendinitis, right leg: Secondary | ICD-10-CM | POA: Diagnosis not present

## 2022-05-17 MED ORDER — METHYLPREDNISOLONE 4 MG PO TBPK: ORAL_TABLET | ORAL | 0 refills | Status: DC

## 2022-05-17 MED ORDER — MELOXICAM 15 MG PO TABS: 15.0000 mg | ORAL_TABLET | Freq: Every day | ORAL | 0 refills | Status: AC

## 2022-05-17 NOTE — Patient Instructions (Signed)
Achilles Tendinitis  with Rehab Achilles tendinitis is a disorder of the Achilles tendon. The Achilles tendon connects the large calf muscles (Gastrocnemius and Soleus) to the heel bone (calcaneus). This tendon is sometimes called the heel cord. It is important for pushing-off and standing on your toes and is important for walking, running, or jumping. Tendinitis is often caused by overuse and repetitive microtrauma. SYMPTOMS  Pain, tenderness, swelling, warmth, and redness may occur over the Achilles tendon even at rest.  Pain with pushing off, or flexing or extending the ankle.  Pain that is worsened after or during activity. CAUSES   Overuse sometimes seen with rapid increase in exercise programs or in sports requiring running and jumping.  Poor physical conditioning (strength and flexibility or endurance).  Running sports, especially training running down hills.  Inadequate warm-up before practice or play or failure to stretch before participation.  Injury to the tendon. PREVENTION   Warm up and stretch before practice or competition.  Allow time for adequate rest and recovery between practices and competition.  Keep up conditioning.  Keep up ankle and leg flexibility.  Improve or keep muscle strength and endurance.  Improve cardiovascular fitness.  Use proper technique.  Use proper equipment (shoes, skates).  To help prevent recurrence, taping, protective strapping, or an adhesive bandage may be recommended for several weeks after healing is complete. PROGNOSIS   Recovery may take weeks to several months to heal.  Longer recovery is expected if symptoms have been prolonged.  Recovery is usually quicker if the inflammation is due to a direct blow as compared with overuse or sudden strain. RELATED COMPLICATIONS   Healing time will be prolonged if the condition is not correctly treated. The injury must be given plenty of time to heal.  Symptoms can reoccur if  activity is resumed too soon.  Untreated, tendinitis may increase the risk of tendon rupture requiring additional time for recovery and possibly surgery. TREATMENT   The first treatment consists of rest anti-inflammatory medication, and ice to relieve the pain.  Stretching and strengthening exercises after resolution of pain will likely help reduce the risk of recurrence. Referral to a physical therapist or athletic trainer for further evaluation and treatment may be helpful.  A walking boot or cast may be recommended to rest the Achilles tendon. This can help break the cycle of inflammation and microtrauma.  Arch supports (orthotics) may be prescribed or recommended by your caregiver as an adjunct to therapy and rest.  Surgery to remove the inflamed tendon lining or degenerated tendon tissue is rarely necessary and has shown less than predictable results. MEDICATION   Nonsteroidal anti-inflammatory medications, such as aspirin and ibuprofen, may be used for pain and inflammation relief. Do not take within 7 days before surgery. Take these as directed by your caregiver. Contact your caregiver immediately if any bleeding, stomach upset, or signs of allergic reaction occur. Other minor pain relievers, such as acetaminophen, may also be used.  Pain relievers may be prescribed as necessary by your caregiver. Do not take prescription pain medication for longer than 4 to 7 days. Use only as directed and only as much as you need. HEAT AND COLD  Cold is used to relieve pain and reduce inflammation for acute and chronic Achilles tendinitis. Cold should be applied for 10 to 15 minutes every 2 to 3 hours for inflammation and pain and immediately after any activity that aggravates your symptoms. Use ice packs or an ice massage.  Heat may be used   before performing stretching and strengthening activities prescribed by your caregiver. Use a heat pack or a warm soak. SEEK MEDICAL CARE IF:  Symptoms get  worse or do not improve in 2 weeks despite treatment.  New, unexplained symptoms develop. Drugs used in treatment may produce side effects.   EXERCISES-- hold each stretch for 30 seconds and repeat 10 times.  Complete each stretch 3 times per day.   RANGE OF MOTION (ROM) AND STRETCHING EXERCISES - Achilles Tendinitis  These exercises may help you when beginning to rehabilitate your injury. Your symptoms may resolve with or without further involvement from your physician, physical therapist or athletic trainer. While completing these exercises, remember:   Restoring tissue flexibility helps normal motion to return to the joints. This allows healthier, less painful movement and activity.  An effective stretch should be held for at least 30 seconds.  A stretch should never be painful. You should only feel a gentle lengthening or release in the stretched tissue. STRETCH  Gastroc, Standing   Place hands on wall.  Extend right / left leg, keeping the front knee somewhat bent.  Slightly point your toes inward on your back foot.  Keeping your right / left heel on the floor and your knee straight, shift your weight toward the wall, not allowing your back to arch.  You should feel a gentle stretch in the right / left calf. Hold this position for __________ seconds. Repeat __________ times. Complete this stretch __________ times per day. STRETCH  Soleus, Standing   Place hands on wall.  Extend right / left leg, keeping the other knee somewhat bent.  Slightly point your toes inward on your back foot.  Keep your right / left heel on the floor, bend your back knee, and slightly shift your weight over the back leg so that you feel a gentle stretch deep in your back calf.  Hold this position for __________ seconds. Repeat __________ times. Complete this stretch __________ times per day. STRETCH  Gastrocsoleus, Standing  Note: This exercise can place a lot of stress on your foot and ankle.  Please complete this exercise only if specifically instructed by your caregiver.   Place the ball of your right / left foot on a step, keeping your other foot firmly on the same step.  Hold on to the wall or a rail for balance.  Slowly lift your other foot, allowing your body weight to press your heel down over the edge of the step.  You should feel a stretch in your right / left calf.  Hold this position for __________ seconds.  Repeat this exercise with a slight bend in your knee. Repeat __________ times. Complete this stretch __________ times per day.  STRENGTHENING EXERCISES - Achilles Tendinitis These exercises may help you when beginning to rehabilitate your injury. They may resolve your symptoms with or without further involvement from your physician, physical therapist or athletic trainer. While completing these exercises, remember:   Muscles can gain both the endurance and the strength needed for everyday activities through controlled exercises.  Complete these exercises as instructed by your physician, physical therapist or athletic trainer. Progress the resistance and repetitions only as guided.  You may experience muscle soreness or fatigue, but the pain or discomfort you are trying to eliminate should never worsen during these exercises. If this pain does worsen, stop and make certain you are following the directions exactly. If the pain is still present after adjustments, discontinue the exercise until you can discuss   the trouble with your clinician. STRENGTH - Plantar-flexors   Sit with your right / left leg extended. Holding onto both ends of a rubber exercise band/tubing, loop it around the ball of your foot. Keep a slight tension in the band.  Slowly push your toes away from you, pointing them downward.  Hold this position for __________ seconds. Return slowly, controlling the tension in the band/tubing. Repeat __________ times. Complete this exercise __________ times  per day.  STRENGTH - Plantar-flexors   Stand with your feet shoulder width apart. Steady yourself with a wall or table using as little support as needed.  Keeping your weight evenly spread over the width of your feet, rise up on your toes.*  Hold this position for __________ seconds. Repeat __________ times. Complete this exercise __________ times per day.  *If this is too easy, shift your weight toward your right / left leg until you feel challenged. Ultimately, you may be asked to do this exercise with your right / left foot only. STRENGTH  Plantar-flexors, Eccentric  Note: This exercise can place a lot of stress on your foot and ankle. Please complete this exercise only if specifically instructed by your caregiver.   Place the balls of your feet on a step. With your hands, use only enough support from a wall or rail to keep your balance.  Keep your knees straight and rise up on your toes.  Slowly shift your weight entirely to your right / left toes and pick up your opposite foot. Gently and with controlled movement, lower your weight through your right / left foot so that your heel drops below the level of the step. You will feel a slight stretch in the back of your calf at the end position.  Use the healthy leg to help rise up onto the balls of both feet, then lower weight only on the right / left leg again. Build up to 15 repetitions. Then progress to 3 consecutive sets of 15 repetitions.*  After completing the above exercise, complete the same exercise with a slight knee bend (about 30 degrees). Again, build up to 15 repetitions. Then progress to 3 consecutive sets of 15 repetitions.* Perform this exercise __________ times per day.  *When you easily complete 3 sets of 15, your physician, physical therapist or athletic trainer may advise you to add resistance by wearing a backpack filled with additional weight. STRENGTH - Plantar Flexors, Seated   Sit on a chair that allows your feet  to rest flat on the ground. If necessary, sit at the edge of the chair.  Keeping your toes firmly on the ground, lift your right / left heel as far as you can without increasing any discomfort in your ankle. Repeat __________ times. Complete this exercise __________ times a day. *If instructed by your physician, physical therapist or athletic trainer, you may add ____________________ of resistance by placing a weighted object on your right / left knee. Document Released: 01/23/2005 Document Revised: 09/16/2011 Document Reviewed: 10/06/2008 [employer] Patient Information 2014 [employer].    

## 2022-05-17 NOTE — Progress Notes (Signed)
  Subjective:  Patient ID: Margaret Briggs, female    DOB: 1965-10-02,  MRN: [id number]  Chief Complaint  Patient presents with   Foot Pain    RIGHT FOOT PAIN    56 y.o. female presents with pain in the right Achilles.  She has had pain for weeks to months.  Seems of gotten worse.  Wanted to get evaluated see if anything can be done.  She had previous Achilles pain in the left side and was treated by Dr. Marylene Land with an injection in the past.  She thinks that the injection did help and she is not having much issue with pain in the left Achilles.  She has tried stretching and icing so far with some relief but not taking any medications at this point time.  Past Medical History:  Diagnosis Date   Anxiety and depression    Depression    Hypothyroid    IBS (irritable bowel syndrome)    Obesity    Stroke (HCC)    Susac's syndrome     No Known Allergies  ROS: Negative except as per HPI above  Objective:  General: AAO x3, NAD  Dermatological: With inspection and palpation of the right and left lower extremities there are no open sores, no preulcerative lesions, no rash or signs of infection present. Nails are of normal length thickness and coloration.   Vascular:  Dorsalis Pedis artery and Posterior Tibial artery pedal pulses are 2/4 bilateral.  Capillary fill time < 3 sec to all digits.   Neruologic: Grossly intact via light touch bilateral. Protective threshold intact to all sites bilateral.   Musculoskeletal: Equinus deformity with decreased DF Rom present. Pain at mid substance of achilles proximal to the insertion  Gait: Unassisted, Nonantalgic.   No images are attached to the encounter.  Radiographs:  Date: 05/17/22 XR the right foot Weightbearing AP/Lateral/Oblique   Findings: plantar calcaneal spur, haglunds deformity. No fracture identified Assessment:   1. Achilles tendinitis, right leg   2. Capsulitis of foot, right      Plan:  Patient was evaluated and treated  and all questions answered.  #Midsubstance Achilles tendinitis right lower extremity -I discussed with the patient that I believe she is experiencing significant Achilles tendinitis. -I recommended course of oral steroid methylprednisolone 4 mg steroid taper pack prescribed for the patient take as directed for 6 days. -Also recommend a course of meloxicam 15 mg take once daily as needed for pain in the right Achilles. -Recommend course of stretching icing and decreased activity -Recommend patient use heat shoes with a high heel wedge on them as well as heel lift insert for her shoes. -Discussed that if the patient continues to have pain in the future we will consider injection as she did have an injection on the left posterior heel in the past that seem to improve her pain.  Return in about 6 weeks (around 06/28/2022) for Follow up R achilles tendinitis.          Corinna Gab, DPM [facility] / [facility]

## 2022-06-28 ENCOUNTER — Ambulatory Visit: Payer: BC Managed Care – PPO | Admitting: Podiatry

## 2022-07-10 ENCOUNTER — Ambulatory Visit: Payer: Self-pay | Admitting: Neurology

## 2022-08-16 ENCOUNTER — Ambulatory Visit: Payer: BC Managed Care – PPO | Admitting: Physician Assistant

## 2022-10-09 ENCOUNTER — Ambulatory Visit: Payer: BC Managed Care – PPO | Admitting: Physician Assistant

## 2022-10-09 ENCOUNTER — Encounter: Payer: Self-pay | Admitting: Physician Assistant

## 2022-10-09 DIAGNOSIS — G2581 Restless legs syndrome: Secondary | ICD-10-CM | POA: Diagnosis not present

## 2022-10-09 DIAGNOSIS — F411 Generalized anxiety disorder: Secondary | ICD-10-CM

## 2022-10-09 DIAGNOSIS — F330 Major depressive disorder, recurrent, mild: Secondary | ICD-10-CM

## 2022-10-09 MED ORDER — BUPROPION HCL ER (XL) 300 MG PO TB24: 300.0000 mg | ORAL_TABLET | Freq: Every day | ORAL | 1 refills | Status: DC

## 2022-10-09 MED ORDER — ALPRAZOLAM 0.5 MG PO TABS: 0.5000 mg | ORAL_TABLET | Freq: Two times a day (BID) | ORAL | 1 refills | Status: DC | PRN

## 2022-10-09 NOTE — Progress Notes (Signed)
[facility] Med Check  Patient ID: OCIA WIDEN,  MRN: [id number]  PCP: Ernestene Kiel, MD  Date of Evaluation: 10/09/2022 Time spent:20 minutes  Chief Complaint:  Chief Complaint   Anxiety; Depression; Follow-up    HISTORY/CURRENT STATUS: HPI for routine med check.  She d/c Celexa about 4-5 months ago. She read that it could cause RLS and that had gotten much worse. After she stopped the Celexa, the RLS improved. She only takes the Ropinerole prn.   After going off the Celexa, she cries easier, but it is triggered.  Is able to enjoy things.  Energy and motivation are fair to good.  No extreme sadness, tearfulness, or feelings of hopelessness.  Sleeps well most of the time, but she's sitting on the couch a lot.  Does not sleep during the day usually.  ADLs and personal hygiene are normal.  States she doesn't want to do housework or whatever.  But she doesn't have any problems going to church, participating in things, goes out with friends.  Denies any changes in concentration, making decisions, or remembering things.  Appetite has not changed.  Weight is stable.  She does take the Xanax occasionally.  She has 2 pills left of a prescription filled in August.  Denies suicidal or homicidal thoughts.  Patient denies increased energy with decreased need for sleep, increased talkativeness, racing thoughts, impulsivity or risky behaviors, increased spending, increased libido, grandiosity, increased irritability or anger, paranoia, or hallucinations.  Denies dizziness, syncope, seizures, numbness, tingling, tremor, tics, unsteady gait, slurred speech, confusion. Denies muscle or joint pain, stiffness, or dystonia.  Individual Medical History/ Review of Systems: Changes? :No   Past medications for mental health diagnoses include: Prozac, Wellbutrin, Paxil, Cymbalta, Celexa, Lamictal, Xanax  Allergies: Patient has no known allergies.  Current Medications:  Current Outpatient  Medications:    buPROPion (WELLBUTRIN XL) 300 MG 24 hr tablet, Take 1 tablet (300 mg total) by mouth daily., Disp: 30 tablet, Rfl: 1   famotidine (PEPCID) 40 MG tablet, Take 40 mg by mouth at bedtime., Disp: , Rfl:    hyoscyamine (LEVSIN SL) 0.125 MG SL tablet, Place 1 tablet under the tongue as needed., Disp: , Rfl:    lamoTRIgine (LAMICTAL) 200 MG tablet, Take 1 tablet (200 mg total) by mouth 2 (two) times daily., Disp: 180 tablet, Rfl: 3   levothyroxine (SYNTHROID, LEVOTHROID) 100 MCG tablet, Take by mouth., Disp: , Rfl:    meloxicam (MOBIC) 15 MG tablet, Take 1 tablet by mouth once daily, Disp: 90 tablet, Rfl: 0   meloxicam (MOBIC) 15 MG tablet, Take 1 tablet (15 mg total) by mouth daily., Disp: 30 tablet, Rfl: 0   rOPINIRole (REQUIP) 1 MG tablet, Take 2 mg by mouth at bedtime. PRN, Disp: , Rfl:    ALPRAZolam (XANAX) 0.5 MG tablet, Take 1 tablet (0.5 mg total) by mouth 2 (two) times daily as needed for anxiety., Disp: 60 tablet, Rfl: 1   aspirin 81 MG EC tablet, Take by mouth. (Patient not taking: Reported on 09/19/2020), Disp: , Rfl:    Ferrous Sulfate (IRON SUPPLEMENT PO), Take 1 tablet by mouth daily. (Patient not taking: Reported on 04/10/2021), Disp: , Rfl:    omeprazole (PRILOSEC) 40 MG capsule, TAKE 1 CAPSULE BY MOUTH TWICE DAILY BEFORE MEAL(S) (Patient not taking: Reported on 10/09/2022), Disp: 60 capsule, Rfl: 0   OZEMPIC, 0.25 OR 0.5 MG/DOSE, 2 MG/1.5ML SOPN, Inject into the skin once a week. Friday (Patient not taking: Reported on 09/19/2020), Disp: , Rfl:  Phentermine-Topiramate (QSYMIA) 7.5-46 MG CP24, Take by mouth. (Patient not taking: Reported on 04/10/2021), Disp: , Rfl:    pregabalin (LYRICA) 75 MG capsule, , Disp: , Rfl:  Medication Side Effects: none  Family Medical/ Social History: Changes? Yes see HPI  MENTAL HEALTH EXAM:  There were no vitals taken for this visit.There is no height or weight on file to calculate BMI.  General Appearance: Casual, Well Groomed, and Obese   Eye Contact:  Good  Speech:  Clear and Coherent and Normal Rate  Volume:  Normal  Mood:  Euthymic  Affect:  Congruent  Thought Process:  Goal Directed and Descriptions of Associations: Circumstantial  Orientation:  Full (Time, Place, and Person)  Thought Content: Logical   Suicidal Thoughts:  No  Homicidal Thoughts:  No  Memory:  WNL  Judgement:  Good  Insight:  Good  Psychomotor Activity:  Normal  Concentration:  Concentration: Good  Recall:  Good  Fund of Knowledge: Good  Language: Good  Assets:  Communication Skills Desire for Improvement Financial Resources/Insurance Housing Resilience Transportation Vocational/Educational  ADL's:  Intact  Cognition: WNL  Prognosis:  Good   DIAGNOSES:    ICD-10-CM   1. Major depressive disorder, recurrent episode, mild  F33.0     2. Generalized anxiety disorder  F41.1     3. Restless leg syndrome  G25.81       Receiving Psychotherapy: Yes     RECOMMENDATIONS:  PDMP reviewed.  Xanax filled 03/04/2022. I provided 20 minutes of face to face time during this encounter, including time spent before and after the visit in records review, medical decision making, counseling pertinent to today's visit, and charting.   We discussed the easy crying.  This is new since going off Celexa.  She does not seem to be having other symptoms of depression except lack of energy to do housework.  I recommend increasing Wellbutrin.  She agrees.  No other changes will be made.  Continue Xanax 0.5 mg, 1 p.o. twice daily as needed. Increase Wellbutrin XL to 300 mg, 1 p.o. every morning. Continue Celexa 40 mg, 1 p.o. daily. Continue Lamictal 200 mg, 1 p.o. twice daily. Continue ropinirole  2 mg nightly as needed. Continue counseling. Return in 6 weeks.  Donnal Moat, PA-C

## 2022-11-20 ENCOUNTER — Ambulatory Visit: Payer: BC Managed Care – PPO | Admitting: Physician Assistant

## 2022-12-15 ENCOUNTER — Other Ambulatory Visit: Payer: Self-pay

## 2022-12-15 MED ORDER — BUPROPION HCL ER (XL) 300 MG PO TB24: 300.0000 mg | ORAL_TABLET | Freq: Every day | ORAL | 1 refills | Status: DC

## 2022-12-23 ENCOUNTER — Ambulatory Visit: Payer: BC Managed Care – PPO | Admitting: Physician Assistant

## 2022-12-23 ENCOUNTER — Encounter: Payer: Self-pay | Admitting: Physician Assistant

## 2022-12-23 DIAGNOSIS — F411 Generalized anxiety disorder: Secondary | ICD-10-CM | POA: Diagnosis not present

## 2022-12-23 DIAGNOSIS — E6609 Other obesity due to excess calories: Secondary | ICD-10-CM

## 2022-12-23 DIAGNOSIS — G2581 Restless legs syndrome: Secondary | ICD-10-CM | POA: Diagnosis not present

## 2022-12-23 DIAGNOSIS — F329 Major depressive disorder, single episode, unspecified: Secondary | ICD-10-CM

## 2022-12-23 MED ORDER — LURASIDONE HCL 20 MG PO TABS: 20.0000 mg | ORAL_TABLET | Freq: Every day | ORAL | 1 refills | Status: DC

## 2022-12-23 NOTE — Progress Notes (Signed)
[facility]  Patient ID: Margaret Briggs,  MRN: [id number]  PCP: Philemon Kingdom, MD  Date of Evaluation: 12/23/2022 Time spent: 28 minutes  Chief Complaint:  Chief Complaint   Depression; Anxiety; Follow-up    HISTORY/CURRENT STATUS: HPI for routine med check.  Not doing well, depressed, cries easily. "I'm not in a good place. I don't want to kill myself but I wouldn't care if I didn't breathe." ADLs are ok but she doesn't want to do any housework or anything. Not sleeping well. Has racing thoughts. Eating is out of control. Sits and does nothing when at home. Feels worthless. Is a [employer] and relies on her faith. Personal hygiene is good. But she did quit shaving her legs for several weeks which isn't like her. No SI/HI.  Anxiety comes and goes, no PA. Gets overwhelmed, feels like something bad is going to happen.   Patient denies increased energy with decreased need for sleep, increased talkativeness, impulsivity or risky behaviors, increased spending, increased libido, grandiosity, increased irritability or anger, paranoia, or hallucinations.  Denies dizziness, syncope, seizures, numbness, tingling, tremor, tics, unsteady gait, slurred speech, confusion. Denies muscle or joint pain, stiffness, or dystonia. Denies unexplained weight loss, frequent infections, or sores that heal slowly.  No polyphagia, polydipsia, or polyuria. Denies visual changes or paresthesias.   Individual Medical History/ Review of Systems: Changes? :No   Past medications for mental health diagnoses include: Prozac, Wellbutrin, Paxil, Cymbalta, Celexa, Lamictal, Xanax  Allergies: Patient has no known allergies.  Current Medications:  Current Outpatient Medications:    ALPRAZolam (XANAX) 0.5 MG tablet, Take 1 tablet (0.5 mg total) by mouth 2 (two) times daily as needed for anxiety., Disp: 60 tablet, Rfl: 1   buPROPion (WELLBUTRIN XL) 300 MG 24 hr tablet, Take 1 tablet (300 mg total)  by mouth daily., Disp: 30 tablet, Rfl: 1   famotidine (PEPCID) 40 MG tablet, Take 40 mg by mouth at bedtime., Disp: , Rfl:    hyoscyamine (LEVSIN SL) 0.125 MG SL tablet, Place 1 tablet under the tongue as needed., Disp: , Rfl:    lamoTRIgine (LAMICTAL) 200 MG tablet, Take 1 tablet (200 mg total) by mouth 2 (two) times daily., Disp: 180 tablet, Rfl: 3   levothyroxine (SYNTHROID, LEVOTHROID) 100 MCG tablet, Take by mouth., Disp: , Rfl:    lurasidone (LATUDA) 20 MG TABS tablet, Take 1 tablet (20 mg total) by mouth daily with supper., Disp: 30 tablet, Rfl: 1   meloxicam (MOBIC) 15 MG tablet, Take 1 tablet by mouth once daily, Disp: 90 tablet, Rfl: 0   meloxicam (MOBIC) 15 MG tablet, Take 1 tablet (15 mg total) by mouth daily., Disp: 30 tablet, Rfl: 0   rOPINIRole (REQUIP) 1 MG tablet, Take 2 mg by mouth at bedtime. PRN, Disp: , Rfl:    aspirin 81 MG EC tablet, Take by mouth. (Patient not taking: Reported on 09/19/2020), Disp: , Rfl:    Ferrous Sulfate (IRON SUPPLEMENT PO), Take 1 tablet by mouth daily. (Patient not taking: Reported on 04/10/2021), Disp: , Rfl:    omeprazole (PRILOSEC) 40 MG capsule, TAKE 1 CAPSULE BY MOUTH TWICE DAILY BEFORE MEAL(S) (Patient not taking: Reported on 10/09/2022), Disp: 60 capsule, Rfl: 0   OZEMPIC, 0.25 OR 0.5 MG/DOSE, 2 MG/1.5ML SOPN, Inject into the skin once a week. Friday (Patient not taking: Reported on 09/19/2020), Disp: , Rfl:    Phentermine-Topiramate (QSYMIA) 7.5-46 MG CP24, Take by mouth. (Patient not taking: Reported on 04/10/2021), Disp: , Rfl:  pregabalin (LYRICA) 75 MG capsule, , Disp: , Rfl:  Medication Side Effects: none  Family Medical/ Social History: Changes? 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  General Appearance: Casual, Well Groomed, and Obese  Eye Contact:  Good  Speech:  Clear and Coherent and Normal Rate   Volume:  Normal  Mood:  Depressed and Worthless  Affect:  Depressed and Tearful  Thought Process:  Goal Directed and Descriptions of Associations: Circumstantial  Orientation:  Full (Time, Place, and Person)  Thought Content: Logical   Suicidal Thoughts:  No  Homicidal Thoughts:  No  Memory:  WNL  Judgement:  Good  Insight:  Good  Psychomotor Activity:  Normal  Concentration:  Concentration: Good  Recall:  Good  Fund of Knowledge: Good  Language: Good  Assets:  Communication Skills Desire for Improvement Financial Resources/Insurance Housing Resilience Transportation Vocational/Educational  ADL's:  Intact  Cognition: WNL  Prognosis:  Good   DIAGNOSES:    ICD-10-CM   1. Treatment-resistant depression  F32.9     2. Generalized anxiety disorder  F41.1     3. Restless leg syndrome  G25.81     4. Obesity due to excess calories without serious comorbidity, unspecified classification  E66.09       Receiving Psychotherapy: Yes   [facility]  RECOMMENDATIONS:  PDMP reviewed.  Xanax filled 11/12/2022. I provided 28 minutes of face to face time during this encounter, including time spent before and after the visit in records review, medical decision making, counseling pertinent to today's visit, and charting.   We discussed different options for treatment resistant depression.  She has never tried adjunct treatment with Abilify, Latuda, Rexulti or any of the other antipsychotics that help major depressive disorder.  I recommend she start latuda.  Rationale was explained. Discussed potential metabolic side effects associated with atypical antipsychotics.  Labs will need to be drawn periodically to monitor metabolic function. Discussed potential risk for movement side effects. Patient understands and accepts these risks and has been advised to contact office if any movement side effects occur.  Other options that we did not discuss at length include TMS, Spravato, or ECT. Also  discussed daily exercise even if walking only 10 or 15 minutes/day at this time.  She will build up to taking longer walks or wanting to do other exercise as time goes by.  Also recommend healthy diet, including 5-9 servings of fruits and vegetables daily along with protein.  Decrease carb intake.  Continue Xanax 0.5 mg, 1 p.o. twice daily as needed. Continue Wellbutrin XL 300 mg, 1 p.o. every morning. Continue Celexa 40 mg, 1 p.o. daily. Continue Lamictal 200 mg, 1 p.o. twice daily. Start Latuda 20 mg, 1 evening with supper.  Importance of taking with food was stressed. Continue ropinirole  2 mg nightly as needed. Continue counseling. I will will order labs at next visit. Return in 4 weeks.  Melony Overly, PA-C

## 2023-01-21 ENCOUNTER — Telehealth: Payer: Self-pay

## 2023-01-21 NOTE — Telephone Encounter (Signed)
Prior Approval received for LURASIDONE 20 MG effective through 12/24/2025 with [employer]

## 2023-01-23 ENCOUNTER — Encounter: Payer: Self-pay | Admitting: Physician Assistant

## 2023-01-23 ENCOUNTER — Ambulatory Visit: Payer: BC Managed Care – PPO | Admitting: Physician Assistant

## 2023-01-23 DIAGNOSIS — G2581 Restless legs syndrome: Secondary | ICD-10-CM

## 2023-01-23 DIAGNOSIS — F329 Major depressive disorder, single episode, unspecified: Secondary | ICD-10-CM | POA: Diagnosis not present

## 2023-01-23 DIAGNOSIS — F411 Generalized anxiety disorder: Secondary | ICD-10-CM | POA: Diagnosis not present

## 2023-01-23 NOTE — Progress Notes (Unsigned)
[facility]  Patient ID: JYL CHICO,  MRN: [id number]  PCP: Philemon Kingdom, MD  Date of Evaluation: 01/23/2023 Time spent: 28 minutes  Chief Complaint:  Chief Complaint   Follow-up    HISTORY/CURRENT STATUS: HPI for routine med check.  We added Latuda one month ago. Feels like it might be helping some.  Isn't as tearful. Still does things she enjoys and is committed to like work at [employer]. Also spends time w/ her grandson every few days. But she reports other issues like racing thoughts, is impulsive, no risky behavior or sexually acting out, but she's not thinking things through, she finishes other people's sentences, denies increased energy with decreased need for sleep,  no increased libido, spending, grandiosity, increased irritability or anger, paranoia, or hallucinations.  Energy and motivation are fair to good.  No feelings of worthlessness or hopelessness.  Sleeps well most of the time. ADLs and personal hygiene are normal.   Denies any changes in concentration, making decisions, or remembering things.  Appetite has not changed.  Weight is stable.  Still gets anxious, overwhelmed at times. No PA.  Denies suicidal or homicidal thoughts.  Denies dizziness, syncope, seizures, numbness, tingling, tremor, tics, unsteady gait, slurred speech, confusion. Denies muscle or joint pain, stiffness, or dystonia. Denies unexplained weight loss, frequent infections, or sores that heal slowly.  No polyphagia, polydipsia, or polyuria. Denies visual changes or paresthesias.   Individual Medical History/ Review of Systems: Changes? :No   Past medications for mental health diagnoses include: Prozac, Wellbutrin, Paxil, Cymbalta, Celexa caused leg jerking, , Lamictal, Xanax  Allergies: Patient has no known allergies.  Current Medications:  Current Outpatient Medications:    ALPRAZolam (XANAX) 0.5 MG tablet, Take 1 tablet (0.5 mg total) by mouth 2 (two) times  daily as needed for anxiety., Disp: 60 tablet, Rfl: 1   buPROPion (WELLBUTRIN XL) 300 MG 24 hr tablet, Take 1 tablet (300 mg total) by mouth daily., Disp: 30 tablet, Rfl: 1   famotidine (PEPCID) 40 MG tablet, Take 40 mg by mouth at bedtime., Disp: , Rfl:    hyoscyamine (LEVSIN SL) 0.125 MG SL tablet, Place 1 tablet under the tongue as needed., Disp: , Rfl:    lamoTRIgine (LAMICTAL) 200 MG tablet, Take 1 tablet (200 mg total) by mouth 2 (two) times daily., Disp: 180 tablet, Rfl: 3   lurasidone (LATUDA) 20 MG TABS tablet, Take 1 tablet (20 mg total) by mouth daily with supper., Disp: 30 tablet, Rfl: 1   rOPINIRole (REQUIP) 1 MG tablet, Take 2 mg by mouth at bedtime. PRN, Disp: , Rfl:    aspirin 81 MG EC tablet, Take by mouth. (Patient not taking: Reported on 09/19/2020), Disp: , Rfl:    Ferrous Sulfate (IRON SUPPLEMENT PO), Take 1 tablet by mouth daily. (Patient not taking: Reported on 04/10/2021), Disp: , Rfl:    levothyroxine (SYNTHROID, LEVOTHROID) 100 MCG tablet, Take by mouth., Disp: , Rfl:    meloxicam (MOBIC) 15 MG tablet, Take 1 tablet by mouth once daily (Patient not taking: Reported on 01/23/2023), Disp: 90 tablet, Rfl: 0   meloxicam (MOBIC) 15 MG tablet, Take 1 tablet (15 mg total) by mouth daily. (Patient not taking: Reported on 01/23/2023), Disp: 30 tablet, Rfl: 0   omeprazole (PRILOSEC) 40 MG capsule, TAKE 1 CAPSULE BY MOUTH TWICE DAILY BEFORE MEAL(S) (Patient not taking: Reported on 10/09/2022), Disp: 60 capsule, Rfl: 0   OZEMPIC, 0.25 OR 0.5 MG/DOSE, 2 MG/1.5ML SOPN, Inject into the skin  once a week. Friday (Patient not taking: Reported on 09/19/2020), Disp: , Rfl:    Phentermine-Topiramate (QSYMIA) 7.5-46 MG CP24, Take by mouth. (Patient not taking: Reported on 04/10/2021), Disp: , Rfl:    pregabalin (LYRICA) 75 MG capsule, , Disp: , Rfl:  Medication Side Effects: none  Family Medical/ Social History: Changes?no  MENTAL HEALTH EXAM:  There were no vitals taken for this visit.There is no  height or weight on file to calculate BMI.  General Appearance: Casual, Well Groomed, and Obese  Eye Contact:  Good  Speech:  Clear and Coherent and Normal Rate  Volume:  Normal  Mood:   sad  Affect:  Tearful  Thought Process:  Goal Directed and Descriptions of Associations: Circumstantial  Orientation:  Full (Time, Place, and Person)  Thought Content: Logical   Suicidal Thoughts:  No  Homicidal Thoughts:  No  Memory:  WNL  Judgement:  Good  Insight:  Good  Psychomotor Activity:  Normal  Concentration:  Concentration: Good  Recall:  Good  Fund of Knowledge: Good  Language: Good  Assets:  Communication Skills Desire for Improvement Financial Resources/Insurance Housing Resilience Transportation Vocational/Educational  ADL's:  Intact  Cognition: WNL  Prognosis:  Good   Labs from 01/17/2023 on chart.   DIAGNOSES:    ICD-10-CM   1. Treatment-resistant depression  F32.9     2. Generalized anxiety disorder  F41.1     3. Restless leg syndrome  G25.81      Receiving Psychotherapy: Yes   [facility]  RECOMMENDATIONS:  PDMP reviewed.  Xanax filled 11/12/2022. I provided 28  minutes of face to face time during this encounter, including time spent before and after the visit in records review, medical decision making, counseling pertinent to today's visit, and charting.   Since she's been on the Latuda for only a month now, we elect to cont current dose. It's not wrong to increase but she feels a little better depression-wise so we'll wait and see how much improvement she has before increasing.  Hopefully we won't need to. She can call in a few weeks if not a lot better, or worse at any time, and we'll increase it over the phone. She understands and agrees.   Continue Xanax 0.5 mg, 1 p.o. twice daily as needed. Continue Wellbutrin XL 300 mg, 1 p.o. every morning. Continue Lamictal 200 mg, 1 p.o. twice daily. Continue Latuda 20 mg, 1 evening with supper.  Continue ropinirole   2 mg nightly as needed. Continue counseling. Return in 4 weeks.  Melony Overly, PA-C

## 2023-02-27 ENCOUNTER — Ambulatory Visit: Payer: BC Managed Care – PPO | Admitting: Physician Assistant

## 2023-02-27 ENCOUNTER — Encounter: Payer: Self-pay | Admitting: Physician Assistant

## 2023-02-27 DIAGNOSIS — G2581 Restless legs syndrome: Secondary | ICD-10-CM | POA: Diagnosis not present

## 2023-02-27 DIAGNOSIS — F329 Major depressive disorder, single episode, unspecified: Secondary | ICD-10-CM | POA: Diagnosis not present

## 2023-02-27 DIAGNOSIS — F411 Generalized anxiety disorder: Secondary | ICD-10-CM

## 2023-02-27 DIAGNOSIS — E6609 Other obesity due to excess calories: Secondary | ICD-10-CM

## 2023-02-27 MED ORDER — LURASIDONE HCL 40 MG PO TABS: 40.0000 mg | ORAL_TABLET | Freq: Every day | ORAL | 1 refills | Status: DC

## 2023-02-27 NOTE — Progress Notes (Signed)
[facility]  Patient ID: Margaret Briggs,  MRN: [id number]  PCP: Philemon Kingdom, MD  Date of Evaluation: 02/27/2023 Time spent:20 minutes  Chief Complaint:  Chief Complaint   Anxiety; Depression; Follow-up    HISTORY/CURRENT STATUS: HPI for routine med check.  Margaret Briggs has helped her mood.  She is not as easy to get sad.  She is able to enjoy things.  She has been subbing since school recently started.  Energy and motivation are pretty good.  She is sleeping well.  Appetite is normal and weight is stable.  ADLs and personal hygiene are normal.  No suicidal or homicidal thoughts.  The main problem she is having now is obsessing about things.  If she keeps her mind off things and stays busy then it is not much of a problem.  But if she gets something on her mind and is not doing anything like working or spending time with family then she goes down the rabbit hole and cannot get things off her mind.  She also has generalized anxiety.  She has been taking the Xanax more often since I have encouraged her to do so.  It does help but sometimes makes her too drowsy.  She would rather not need it then have to take it.  Patient denies increased energy with decreased need for sleep, increased talkativeness, racing thoughts, impulsivity or risky behaviors, increased spending, increased libido, grandiosity, increased irritability or anger, paranoia, or hallucinations.  Denies dizziness, syncope, seizures, numbness, tingling, tremor, tics, unsteady gait, slurred speech, confusion. Denies muscle or joint pain, stiffness, or dystonia. Denies unexplained weight loss, frequent infections, or sores that heal slowly.  No polyphagia, polydipsia, or polyuria. Denies visual changes or paresthesias.   Individual Medical History/ Review of Systems: Changes? :No   Past medications for mental health diagnoses include: Prozac, Wellbutrin, Paxil, Cymbalta, Celexa caused leg jerking, , Lamictal,  Xanax  Allergies: Patient has no known allergies.  Current Medications:  Current Outpatient Medications:    ALPRAZolam (XANAX) 0.5 MG tablet, Take 1 tablet (0.5 mg total) by mouth 2 (two) times daily as needed for anxiety., Disp: 60 tablet, Rfl: 1   buPROPion (WELLBUTRIN XL) 300 MG 24 hr tablet, Take 1 tablet (300 mg total) by mouth daily., Disp: 30 tablet, Rfl: 1   famotidine (PEPCID) 40 MG tablet, Take 40 mg by mouth at bedtime., Disp: , Rfl:    hyoscyamine (LEVSIN SL) 0.125 MG SL tablet, Place 1 tablet under the tongue as needed., Disp: , Rfl:    lamoTRIgine (LAMICTAL) 200 MG tablet, Take 1 tablet (200 mg total) by mouth 2 (two) times daily., Disp: 180 tablet, Rfl: 3   levothyroxine (SYNTHROID, LEVOTHROID) 100 MCG tablet, Take by mouth., Disp: , Rfl:    lurasidone (LATUDA) 40 MG TABS tablet, Take 1 tablet (40 mg total) by mouth daily with supper., Disp: 30 tablet, Rfl: 1   rOPINIRole (REQUIP) 1 MG tablet, Take 2 mg by mouth at bedtime. PRN, Disp: , Rfl:    solifenacin (VESICARE) 5 MG tablet, Take by mouth., Disp: , Rfl:    aspirin 81 MG EC tablet, Take by mouth. (Patient not taking: Reported on 09/19/2020), Disp: , Rfl:    Ferrous Sulfate (IRON SUPPLEMENT PO), Take 1 tablet by mouth daily. (Patient not taking: Reported on 04/10/2021), Disp: , Rfl:    meloxicam (MOBIC) 15 MG tablet, Take 1 tablet by mouth once daily (Patient not taking: Reported on 01/23/2023), Disp: 90 tablet, Rfl: 0   meloxicam (MOBIC)  15 MG tablet, Take 1 tablet (15 mg total) by mouth daily. (Patient not taking: Reported on 01/23/2023), Disp: 30 tablet, Rfl: 0   omeprazole (PRILOSEC) 40 MG capsule, TAKE 1 CAPSULE BY MOUTH TWICE DAILY BEFORE MEAL(S) (Patient not taking: Reported on 10/09/2022), Disp: 60 capsule, Rfl: 0   OZEMPIC, 0.25 OR 0.5 MG/DOSE, 2 MG/1.5ML SOPN, Inject into the skin once a week. Friday (Patient not taking: Reported on 09/19/2020), Disp: , Rfl:    Phentermine-Topiramate (QSYMIA) 7.5-46 MG CP24, Take by mouth.  (Patient not taking: Reported on 04/10/2021), Disp: , Rfl:    pregabalin (LYRICA) 75 MG capsule, , Disp: , Rfl:  Medication Side Effects: none  Family Medical/ Social History: Changes?no  MENTAL HEALTH EXAM:  There were no vitals taken for this visit.There is no height or weight on file to calculate BMI.  General Appearance: Casual, Well Groomed, and Obese  Eye Contact:  Good  Speech:  Clear and Coherent and Normal Rate  Volume:  Normal  Mood:  Euthymic  Affect:  Congruent  Thought Process:  Goal Directed and Descriptions of Associations: Circumstantial  Orientation:  Full (Time, Place, and Person)  Thought Content: Logical   Suicidal Thoughts:  No  Homicidal Thoughts:  No  Memory:  WNL  Judgement:  Good  Insight:  Good  Psychomotor Activity:  Normal  Concentration:  Attention Span: Good  Recall:  Good  Fund of Knowledge: Good  Language: Good  Assets:  Communication Skills Desire for Improvement Financial Resources/Insurance Housing Resilience Social Support Transportation Vocational/Educational  ADL's:  Intact  Cognition: WNL  Prognosis:  Good    DIAGNOSES:    ICD-10-CM   1. Generalized anxiety disorder  F41.1     2. Treatment-resistant depression  F32.9     3. Restless leg syndrome  G25.81     4. Obesity due to excess calories without serious comorbidity, unspecified classification  E66.09       Receiving Psychotherapy: Yes   [facility]  RECOMMENDATIONS:  PDMP reviewed.  Xanax filled 11/12/2022. I provided 20 minutes of face to face time during this encounter, including time spent before and after the visit in records review, medical decision making, counseling pertinent to today's visit, and charting.   We discussed her situation.  She is having obsessive thoughts and an SSRI might be useful to add back again.  However most recently she was on Celexa and it caused her legs to jerk.  And looking back she does not see that the obsessions worsened after  stopping that.  The jerking motions did though.  I recommend increasing the Latuda, as it has helped with mood and maybe with the generalized anxiety some as well.  She would like to increase the dose.  Continue Xanax 0.5 mg, 1 p.o. twice daily as needed. Continue Wellbutrin XL 300 mg, 1 p.o. every morning. Continue Lamictal 200 mg, 1 p.o. twice daily. Increase Latuda to 40 mg, 1 evening with supper.  Continue ropinirole  2 mg nightly as needed. Continue counseling. Return in 4 weeks.  Melony Overly, PA-C

## 2023-04-10 ENCOUNTER — Ambulatory Visit: Payer: BC Managed Care – PPO | Admitting: Physician Assistant

## 2023-04-21 ENCOUNTER — Encounter: Payer: Self-pay | Admitting: Physician Assistant

## 2023-04-21 ENCOUNTER — Ambulatory Visit: Payer: BC Managed Care – PPO | Admitting: Physician Assistant

## 2023-04-21 DIAGNOSIS — F329 Major depressive disorder, single episode, unspecified: Secondary | ICD-10-CM | POA: Diagnosis not present

## 2023-04-21 DIAGNOSIS — G2581 Restless legs syndrome: Secondary | ICD-10-CM

## 2023-04-21 DIAGNOSIS — F411 Generalized anxiety disorder: Secondary | ICD-10-CM | POA: Diagnosis not present

## 2023-04-21 MED ORDER — LAMOTRIGINE 200 MG PO TABS: 200.0000 mg | ORAL_TABLET | Freq: Two times a day (BID) | ORAL | 1 refills | Status: DC

## 2023-04-21 MED ORDER — BUPROPION HCL ER (XL) 300 MG PO TB24: 300.0000 mg | ORAL_TABLET | Freq: Every day | ORAL | 1 refills | Status: DC

## 2023-04-21 MED ORDER — LURASIDONE HCL 60 MG PO TABS: 60.0000 mg | ORAL_TABLET | Freq: Every day | ORAL | 1 refills | Status: DC

## 2023-04-21 NOTE — Progress Notes (Unsigned)
[facility]  Patient ID: Margaret Briggs,  MRN: [id number]  PCP: Philemon Kingdom, MD  Date of Evaluation: 04/21/2023 Time spent:20 minutes  Chief Complaint:  Chief Complaint   Anxiety; Depression; Follow-up    HISTORY/CURRENT STATUS: HPI for routine med check.  We increased the Latuda 6 weeks ago, she isn't crying      Denies dizziness, syncope, seizures, numbness, tingling, tremor, tics, unsteady gait, slurred speech, confusion. Denies muscle or joint pain, stiffness, or dystonia. Denies unexplained weight loss, frequent infections, or sores that heal slowly.  No polyphagia, polydipsia, or polyuria. Denies visual changes or paresthesias.   Individual Medical History/ Review of Systems: Changes? :No   Past medications for mental health diagnoses include: Prozac, Wellbutrin, Paxil, Cymbalta, Celexa caused leg jerking, , Lamictal, Xanax  Allergies: Patient has no known allergies.  Current Medications:  Current Outpatient Medications:    ALPRAZolam (XANAX) 0.5 MG tablet, Take 1 tablet (0.5 mg total) by mouth 2 (two) times daily as needed for anxiety., Disp: 60 tablet, Rfl: 1   famotidine (PEPCID) 40 MG tablet, Take 40 mg by mouth at bedtime., Disp: , Rfl:    hyoscyamine (LEVSIN SL) 0.125 MG SL tablet, Place 1 tablet under the tongue as needed., Disp: , Rfl:    levothyroxine (SYNTHROID, LEVOTHROID) 100 MCG tablet, Take by mouth., Disp: , Rfl:    Lurasidone HCl 60 MG TABS, Take 1 tablet (60 mg total) by mouth daily with supper., Disp: 30 tablet, Rfl: 1   rOPINIRole (REQUIP) 1 MG tablet, Take 2 mg by mouth at bedtime. PRN, Disp: , Rfl:    solifenacin (VESICARE) 5 MG tablet, Take by mouth., Disp: , Rfl:    aspirin 81 MG EC tablet, Take by mouth. (Patient not taking: Reported on 09/19/2020), Disp: , Rfl:    buPROPion (WELLBUTRIN XL) 300 MG 24 hr tablet, Take 1 tablet (300 mg total) by mouth daily., Disp: 90 tablet, Rfl: 1   Ferrous Sulfate (IRON SUPPLEMENT  PO), Take 1 tablet by mouth daily. (Patient not taking: Reported on 04/10/2021), Disp: , Rfl:    lamoTRIgine (LAMICTAL) 200 MG tablet, Take 1 tablet (200 mg total) by mouth 2 (two) times daily., Disp: 180 tablet, Rfl: 1   meloxicam (MOBIC) 15 MG tablet, Take 1 tablet by mouth once daily (Patient not taking: Reported on 01/23/2023), Disp: 90 tablet, Rfl: 0   meloxicam (MOBIC) 15 MG tablet, Take 1 tablet (15 mg total) by mouth daily. (Patient not taking: Reported on 01/23/2023), Disp: 30 tablet, Rfl: 0   omeprazole (PRILOSEC) 40 MG capsule, TAKE 1 CAPSULE BY MOUTH TWICE DAILY BEFORE MEAL(S) (Patient not taking: Reported on 10/09/2022), Disp: 60 capsule, Rfl: 0   OZEMPIC, 0.25 OR 0.5 MG/DOSE, 2 MG/1.5ML SOPN, Inject into the skin once a week. Friday (Patient not taking: Reported on 09/19/2020), Disp: , Rfl:    Phentermine-Topiramate (QSYMIA) 7.5-46 MG CP24, Take by mouth. (Patient not taking: Reported on 04/10/2021), Disp: , Rfl:    pregabalin (LYRICA) 75 MG capsule, , Disp: , Rfl:  Medication Side Effects: none  Family Medical/ Social History: Changes? no  MENTAL HEALTH EXAM:  There were no vitals taken for this visit.There is no height or weight on file to calculate BMI.  General Appearance: Casual, Well Groomed, and Obese  Eye Contact:  Good  Speech:  Clear and Coherent and Normal Rate  Volume:  Normal  Mood:  Euthymic  Affect:  Congruent  Thought Process:  Goal Directed and Descriptions of Associations: Circumstantial  Orientation:  Full (Time, Place, and Person)  Thought Content: Logical   Suicidal Thoughts:  No  Homicidal Thoughts:  No  Memory:  WNL  Judgement:  Good  Insight:  Good  Psychomotor Activity:  Normal  Concentration:  Attention Span: Good  Recall:  Good  Fund of Knowledge: Good  Language: Good  Assets:  Communication Skills Desire for Improvement Financial Resources/Insurance Housing Resilience Social Support Transportation  ADL's:  Intact  Cognition: WNL   Prognosis:  Good   DIAGNOSES:  No diagnosis found.  Receiving Psychotherapy: Yes   [facility]  RECOMMENDATIONS:  PDMP reviewed.  Xanax filled 11/12/2022.       Continue Xanax 0.5 mg, 1 p.o. twice daily as needed. Continue Wellbutrin XL 300 mg, 1 p.o. every morning. Continue Lamictal 200 mg, 1 p.o. twice daily. Increase Latuda to 40 mg, 1 evening with supper.  Continue ropinirole  2 mg nightly as needed. Continue BiPap. Continue counseling. Return in 4-6 weeks.  Melony Overly, PA-C

## 2023-05-04 ENCOUNTER — Other Ambulatory Visit: Payer: Self-pay

## 2023-05-05 MED ORDER — ALPRAZOLAM 0.5 MG PO TABS: 0.5000 mg | ORAL_TABLET | Freq: Two times a day (BID) | ORAL | 0 refills | Status: DC | PRN

## 2023-05-06 ENCOUNTER — Telehealth: Payer: Self-pay | Admitting: Physician Assistant

## 2023-05-06 NOTE — Telephone Encounter (Signed)
Pt called and said that the increase on the latuda made her sugar to high. So she has gone back to original dose.

## 2023-05-07 NOTE — Telephone Encounter (Signed)
[facility]

## 2023-05-21 ENCOUNTER — Ambulatory Visit: Payer: BC Managed Care – PPO | Admitting: Physician Assistant

## 2023-06-29 ENCOUNTER — Other Ambulatory Visit: Payer: Self-pay

## 2023-06-30 MED ORDER — ALPRAZOLAM 0.5 MG PO TABS: 0.5000 mg | ORAL_TABLET | Freq: Two times a day (BID) | ORAL | 0 refills | Status: DC | PRN

## 2023-09-12 ENCOUNTER — Telehealth: Payer: Self-pay | Admitting: Physician Assistant

## 2023-09-12 ENCOUNTER — Other Ambulatory Visit: Payer: Self-pay

## 2023-09-12 MED ORDER — ALPRAZOLAM 0.5 MG PO TABS: 0.5000 mg | ORAL_TABLET | Freq: Two times a day (BID) | ORAL | 0 refills | Status: DC | PRN

## 2023-09-12 NOTE — Telephone Encounter (Signed)
 Pended Xanax to [facility]

## 2023-09-12 NOTE — Telephone Encounter (Signed)
 Pt has an appt in April. She needs a refill of her xanax. Pharmacy is [facility] in [place]

## 2023-10-28 ENCOUNTER — Encounter: Payer: Self-pay | Admitting: Physician Assistant

## 2023-10-28 ENCOUNTER — Ambulatory Visit (INDEPENDENT_AMBULATORY_CARE_PROVIDER_SITE_OTHER): Payer: Self-pay | Admitting: Physician Assistant

## 2023-10-28 DIAGNOSIS — F329 Major depressive disorder, single episode, unspecified: Secondary | ICD-10-CM

## 2023-10-28 DIAGNOSIS — G2581 Restless legs syndrome: Secondary | ICD-10-CM

## 2023-10-28 DIAGNOSIS — F411 Generalized anxiety disorder: Secondary | ICD-10-CM | POA: Diagnosis not present

## 2023-10-28 MED ORDER — LAMOTRIGINE 200 MG PO TABS: 200.0000 mg | ORAL_TABLET | Freq: Two times a day (BID) | ORAL | 1 refills | Status: DC

## 2023-10-28 MED ORDER — BUPROPION HCL ER (XL) 300 MG PO TB24: 300.0000 mg | ORAL_TABLET | Freq: Every day | ORAL | 1 refills | Status: DC

## 2023-10-28 MED ORDER — ALPRAZOLAM 0.5 MG PO TABS
0.5000 mg | ORAL_TABLET | Freq: Two times a day (BID) | ORAL | 5 refills | Status: DC | PRN
Start: 2023-10-28 — End: 2024-04-28

## 2023-10-28 NOTE — Progress Notes (Signed)
 [facility]  Patient ID: Margaret Briggs,  MRN: [id number]  PCP: Olan Bering, MD  Date of Evaluation: 10/28/2023 Time spent:20 minutes  Chief Complaint:  Chief Complaint   Anxiety; Depression; Follow-up    HISTORY/CURRENT STATUS: HPI for routine med check.  She stopped the Latuda  b/c of wt gain. She is 'ok' and feels like her meds are working fine. Her dtr got married last weekend which was a happy occas and Simisola did fine. Not too anxious. Her dtr lost her baby 2 weeks before her wedding so that was sad even though it was a happy day.    Patient is able to enjoy things.  Energy and motivation are good.  No extreme sadness, tearfulness, or feelings of hopelessness.  Sleeps well most of the time. ADLs and personal hygiene are normal.  She keeps her grandson every Tuesday.  Her other daughter is expecting her second son in June.  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  Denies any changes in concentration, making decisions, or remembering things.  Appetite has not changed.  Weight is stable.  She still gets anxious sometimes, rarely has panic attacks though.  She takes the Xanax  occasionally if she is too overwhelmed.  It is still effective.  Denies suicidal or homicidal thoughts.  Patient denies increased energy with decreased need for sleep, increased talkativeness, racing thoughts, impulsivity or risky behaviors, increased spending, increased libido, grandiosity, increased irritability or anger, paranoia, or hallucinations.  Denies dizziness, syncope, seizures, numbness, tingling, tremor, tics, unsteady gait, slurred speech, confusion. Denies muscle or joint pain, stiffness, or dystonia.  Individual Medical History/ Review of Systems: Changes? :No   Past medications for mental health diagnoses include: Prozac, Wellbutrin , Paxil, Cymbalta, Celexa  caused leg jerking, , Lamictal , Xanax   Allergies: Patient has no known  allergies.  Current Medications:  Current Outpatient Medications:    famotidine (PEPCID) 40 MG tablet, Take 40 mg by mouth at bedtime., Disp: , Rfl:    hyoscyamine (LEVSIN SL) 0.125 MG SL tablet, Place 1 tablet under the tongue as needed., Disp: , Rfl:    levothyroxine (SYNTHROID, LEVOTHROID) 100 MCG tablet, Take by mouth., Disp: , Rfl:    rOPINIRole (REQUIP) 1 MG tablet, Take 2 mg by mouth at bedtime. PRN, Disp: , Rfl:    solifenacin (VESICARE) 5 MG tablet, Take by mouth., Disp: , Rfl:    ALPRAZolam  (XANAX ) 0.5 MG tablet, Take 1 tablet (0.5 mg total) by mouth 2 (two) times daily as needed for anxiety., Disp: 60 tablet, Rfl: 5   aspirin 81 MG EC tablet, Take by mouth. (Patient not taking: Reported on 09/19/2020), Disp: , Rfl:    buPROPion  (WELLBUTRIN  XL) 300 MG 24 hr tablet, Take 1 tablet (300 mg total) by mouth daily., Disp: 90 tablet, Rfl: 1   Ferrous Sulfate (IRON SUPPLEMENT PO), Take 1 tablet by mouth daily. (Patient not taking: Reported on 10/28/2023), Disp: , Rfl:    lamoTRIgine  (LAMICTAL ) 200 MG tablet, Take 1 tablet (200 mg total) by mouth 2 (two) times daily., Disp: 180 tablet, Rfl: 1   meloxicam  (MOBIC ) 15 MG tablet, Take 1 tablet by mouth once daily (Patient not taking: Reported on 10/28/2023), Disp: 90 tablet, Rfl: 0   meloxicam  (MOBIC ) 15 MG tablet, Take 1 tablet (15 mg total) by mouth daily. (Patient not taking: Reported on 10/28/2023), Disp: 30 tablet, Rfl: 0   omeprazole  (PRILOSEC) 40 MG capsule, TAKE 1 CAPSULE BY MOUTH TWICE DAILY BEFORE MEAL(S) (Patient not taking:  Reported on 10/28/2023), Disp: 60 capsule, Rfl: 0   OZEMPIC, 0.25 OR 0.5 MG/DOSE, 2 MG/1.5ML SOPN, Inject into the skin once a week. Friday (Patient not taking: Reported on 10/28/2023), Disp: , Rfl:    Phentermine-Topiramate (QSYMIA) 7.5-46 MG CP24, Take by mouth. (Patient not taking: Reported on 10/28/2023), Disp: , Rfl:    pregabalin (LYRICA) 75 MG capsule, , Disp: , Rfl:  Medication Side Effects: weight gain  Family  Medical/ Social History: Changes? no  MENTAL HEALTH EXAM:  There were no vitals taken for this visit.There is no height or weight on file to calculate BMI.  General Appearance: Casual, Well Groomed, and Obese  Eye Contact:  Good  Speech:  Clear and Coherent and Normal Rate  Volume:  Normal  Mood:  Euthymic  Affect:  Congruent  Thought Process:  Goal Directed and Descriptions of Associations: Circumstantial  Orientation:  Full (Time, Place, and Person)  Thought Content: Logical   Suicidal Thoughts:  No  Homicidal Thoughts:  No  Memory:  WNL  Judgement:  Good  Insight:  Good  Psychomotor Activity:  Normal  Concentration:  Attention Span: Good  Recall:  Good  Fund of Knowledge: Good  Language: Good  Assets:  Communication Skills Desire for Improvement Financial Resources/Insurance Housing Resilience Social Support Transportation  ADL's:  Intact  Cognition: WNL  Prognosis:  Good   DIAGNOSES:    ICD-10-CM   1. Treatment-resistant depression  F32.9     2. Generalized anxiety disorder  F41.1     3. Restless leg syndrome  G25.81       Receiving Psychotherapy: Yes   [facility]  RECOMMENDATIONS:  PDMP reviewed.  Xanax  filled 09/12/2023. I provided 20 minutes of face to face time during this encounter, including time spent before and after the visit in records review, medical decision making, counseling pertinent to today's visit, and charting.   Avangeline is doing well with current medications so no changes will be made.  Continue Xanax  0.5 mg, 1 p.o. twice daily as needed. Continue Wellbutrin  XL 300 mg, 1 p.o. every morning. Continue Lamictal  200 mg, 1 p.o. twice daily. Continue ropinirole  2 mg nightly as needed. Continue BiPap. Continue counseling. Return in 6 months.  Marvia Slocumb, PA-C

## 2024-04-28 ENCOUNTER — Encounter: Payer: Self-pay | Admitting: Physician Assistant

## 2024-04-28 ENCOUNTER — Ambulatory Visit (INDEPENDENT_AMBULATORY_CARE_PROVIDER_SITE_OTHER): Admitting: Physician Assistant

## 2024-04-28 DIAGNOSIS — F411 Generalized anxiety disorder: Secondary | ICD-10-CM | POA: Diagnosis not present

## 2024-04-28 DIAGNOSIS — G2581 Restless legs syndrome: Secondary | ICD-10-CM

## 2024-04-28 DIAGNOSIS — F329 Major depressive disorder, single episode, unspecified: Secondary | ICD-10-CM

## 2024-04-28 MED ORDER — LAMOTRIGINE 200 MG PO TABS: 200.0000 mg | ORAL_TABLET | Freq: Two times a day (BID) | ORAL | 1 refills | Status: AC

## 2024-04-28 MED ORDER — ALPRAZOLAM 0.5 MG PO TABS: 0.5000 mg | ORAL_TABLET | Freq: Two times a day (BID) | ORAL | 5 refills | Status: AC | PRN

## 2024-04-28 MED ORDER — BUPROPION HCL ER (XL) 150 MG PO TB24: 450.0000 mg | ORAL_TABLET | Freq: Every day | ORAL | 1 refills | Status: AC

## 2024-04-28 NOTE — Progress Notes (Signed)
 [facility]  Patient ID: MURIEL HANNOLD,  MRN: [id number]  PCP: Jefferey Fitch, MD  Date of Evaluation: 04/28/2024 Time spent:20 minutes  Chief Complaint:  Chief Complaint   Depression    HISTORY/CURRENT STATUS: HPI for routine med check.  States she's in a 'funk.' She gets up and goes to work, doing part-time, engineer, manufacturing. She doesn't have any motivation. She sits in her recliner, scrolling on her phone. Cries easily at times, but not sure why.   Sleeps well.  ADLs and personal hygiene are normal.   Denies any changes in concentration, making decisions, or remembering things.  Her PCP gave her Vyvanse to help her with motivation but it really didn't help.  Appetite has not changed.  Weight is stable.  Anxiety is controlled.  No PA but obsesses about things sometimes. Xanax  helps.  No mania, delirium, AH/VH.  No SI/HI.  Individual Medical History/ Review of Systems: Changes? :No   Past medications for mental health diagnoses include: Prozac, Wellbutrin , Paxil, Cymbalta, Celexa  caused leg jerking, , Lamictal , Xanax , Vyvanse for motivation was not helpful  Allergies: Patient has no known allergies.  Current Medications:  Current Outpatient Medications:    buPROPion  (WELLBUTRIN  XL) 150 MG 24 hr tablet, Take 3 tablets (450 mg total) by mouth daily., Disp: 270 tablet, Rfl: 1   famotidine (PEPCID) 40 MG tablet, Take 40 mg by mouth at bedtime., Disp: , Rfl:    hyoscyamine (LEVSIN SL) 0.125 MG SL tablet, Place 1 tablet under the tongue as needed., Disp: , Rfl:    levothyroxine (SYNTHROID, LEVOTHROID) 100 MCG tablet, Take by mouth., Disp: , Rfl:    rOPINIRole (REQUIP) 1 MG tablet, Take 2 mg by mouth at bedtime. PRN, Disp: , Rfl:    solifenacin (VESICARE) 5 MG tablet, Take by mouth., Disp: , Rfl:    ALPRAZolam  (XANAX ) 0.5 MG tablet, Take 1 tablet (0.5 mg total) by mouth 2 (two) times daily as needed for anxiety., Disp: 60 tablet, Rfl: 5   aspirin 81 MG EC  tablet, Take by mouth. (Patient not taking: Reported on 09/19/2020), Disp: , Rfl:    Ferrous Sulfate (IRON SUPPLEMENT PO), Take 1 tablet by mouth daily. (Patient not taking: Reported on 10/28/2023), Disp: , Rfl:    lamoTRIgine  (LAMICTAL ) 200 MG tablet, Take 1 tablet (200 mg total) by mouth 2 (two) times daily., Disp: 180 tablet, Rfl: 1   meloxicam  (MOBIC ) 15 MG tablet, Take 1 tablet by mouth once daily (Patient not taking: Reported on 10/28/2023), Disp: 90 tablet, Rfl: 0   meloxicam  (MOBIC ) 15 MG tablet, Take 1 tablet (15 mg total) by mouth daily. (Patient not taking: Reported on 10/28/2023), Disp: 30 tablet, Rfl: 0   omeprazole  (PRILOSEC) 40 MG capsule, TAKE 1 CAPSULE BY MOUTH TWICE DAILY BEFORE MEAL(S) (Patient not taking: Reported on 10/28/2023), Disp: 60 capsule, Rfl: 0   OZEMPIC, 0.25 OR 0.5 MG/DOSE, 2 MG/1.5ML SOPN, Inject into the skin once a week. Friday (Patient not taking: Reported on 10/28/2023), Disp: , Rfl:    Phentermine-Topiramate (QSYMIA) 7.5-46 MG CP24, Take by mouth. (Patient not taking: Reported on 10/28/2023), Disp: , Rfl:    pregabalin (LYRICA) 75 MG capsule, , Disp: , Rfl:  Medication Side Effects: weight gain  Family Medical/ Social History: Changes? no  MENTAL HEALTH EXAM:  There were no vitals taken for this visit.There is no height or weight on file to calculate BMI.  General Appearance: Casual, Well Groomed, and Obese  Eye Contact:  Good  Speech:  Clear and Coherent and Normal Rate  Volume:  Normal  Mood:  Sad  Affect:  Congruent  Thought Process:  Goal Directed and Descriptions of Associations: Circumstantial  Orientation:  Full (Time, Place, and Person)  Thought Content: Logical   Suicidal Thoughts:  No  Homicidal Thoughts:  No  Memory:  WNL  Judgement:  Good  Insight:  Good  Psychomotor Activity:  Normal  Concentration:  Attention Span: Good  Recall:  Good  Fund of Knowledge: Good  Language: Good  Assets:  Communication Skills Desire for  Improvement Financial Resources/Insurance Housing Resilience Social Support Transportation  ADL's:  Intact  Cognition: WNL  Prognosis:  Good   DIAGNOSES:    ICD-10-CM   1. Treatment-resistant depression  F32.9     2. Generalized anxiety disorder  F41.1     3. Restless leg syndrome  G25.81       Receiving Psychotherapy: Yes   [facility]  RECOMMENDATIONS:  PDMP reviewed.  Xanax  filled 03/23/2024. I provided approximately  20 minutes of face to face time during this encounter, including time spent before and after the visit in records review, medical decision making, counseling pertinent to today's visit, and charting.   We discussed the depression.  One of the main symptoms is lack of motivation.  I recommend increasing the Wellbutrin .  Pros and cons were discussed and she would like to try it.  Continue Xanax  0.5 mg, 1 p.o. twice daily as needed. Increase Wellbutrin  XL to 150 mg, 3 p.o. every morning. Continue Lamictal  200 mg, 1 p.o. twice daily. Continue ropinirole  2 mg nightly as needed. Continue BiPap. Continue counseling. Return in 6 weeks.              Verneita Cooks, PA-C

## 2024-05-02 ENCOUNTER — Encounter: Payer: Self-pay | Admitting: Physician Assistant

## 2024-06-09 ENCOUNTER — Ambulatory Visit: Admitting: Physician Assistant

## 2024-07-14 ENCOUNTER — Ambulatory Visit: Admitting: Physician Assistant

## 2024-08-06 ENCOUNTER — Ambulatory Visit (INDEPENDENT_AMBULATORY_CARE_PROVIDER_SITE_OTHER): Admitting: Physician Assistant

## 2024-08-06 ENCOUNTER — Encounter: Payer: Self-pay | Admitting: Physician Assistant

## 2024-08-06 DIAGNOSIS — F411 Generalized anxiety disorder: Secondary | ICD-10-CM

## 2024-08-06 DIAGNOSIS — F329 Major depressive disorder, single episode, unspecified: Secondary | ICD-10-CM

## 2024-08-06 NOTE — Progress Notes (Signed)
 "           [facility]  Patient ID: Margaret Briggs,  MRN: [id number]  PCP: Jefferey Fitch, MD  Date of Evaluation: 08/06/2024 Time spent:20 minutes  Chief Complaint:  Chief Complaint   Anxiety; Depression; Follow-up    HISTORY/CURRENT STATUS: HPI for routine med check.  When she increased the Wellbutrin  at the last office visit she started having more nausea.  She thought it might be related so she dropped back to 300 mg.  She still has the nausea but is not quite as severe.  When she was on 450 mg she even threw up a couple of times.  She will see her PCP about it soon.  Her appetite is okay and weight is stable.  She never had nausea and vomiting from the Wellbutrin  before.  She still has low energy and motivation at times.  I know I am just going to have to make myself do things.   No extreme sadness, tearfulness, or feelings of hopelessness.  Sleeps well most of the time.  ADLs and personal hygiene are normal.   Denies any changes in concentration, making decisions, or remembering things.  No panic attacks.  She does get overwhelmed at times easier than others.  Takes the Xanax  when needed and it is effective.  No mania, delirium, AH/VH.  No SI/HI.  Individual Medical History/ Review of Systems: Changes? :No   Past medications for mental health diagnoses include: Prozac, Wellbutrin , Paxil, Cymbalta, Celexa  caused leg jerking, , Lamictal , Xanax , Vyvanse for motivation was not helpful  Allergies: Patient has no known allergies.  Current Medications:  Current Outpatient Medications:    ALPRAZolam  (XANAX ) 0.5 MG tablet, Take 1 tablet (0.5 mg total) by mouth 2 (two) times daily as needed for anxiety., Disp: 60 tablet, Rfl: 5   buPROPion  (WELLBUTRIN  XL) 150 MG 24 hr tablet, Take 3 tablets (450 mg total) by mouth daily. (Patient taking differently: Take 300 mg by mouth daily.), Disp: 270 tablet, Rfl: 1   famotidine (PEPCID) 40 MG tablet, Take 40 mg by mouth at bedtime.,  Disp: , Rfl:    hyoscyamine (LEVSIN SL) 0.125 MG SL tablet, Place 1 tablet under the tongue as needed., Disp: , Rfl:    lamoTRIgine  (LAMICTAL ) 200 MG tablet, Take 1 tablet (200 mg total) by mouth 2 (two) times daily., Disp: 180 tablet, Rfl: 1   levothyroxine (SYNTHROID, LEVOTHROID) 100 MCG tablet, Take by mouth., Disp: , Rfl:    rOPINIRole (REQUIP) 1 MG tablet, Take 2 mg by mouth at bedtime. PRN, Disp: , Rfl:    solifenacin (VESICARE) 5 MG tablet, Take by mouth., Disp: , Rfl:    aspirin 81 MG EC tablet, Take by mouth. (Patient not taking: Reported on 09/19/2020), Disp: , Rfl:    Ferrous Sulfate (IRON SUPPLEMENT PO), Take 1 tablet by mouth daily. (Patient not taking: Reported on 10/28/2023), Disp: , Rfl:    meloxicam  (MOBIC ) 15 MG tablet, Take 1 tablet by mouth once daily (Patient not taking: Reported on 10/28/2023), Disp: 90 tablet, Rfl: 0   meloxicam  (MOBIC ) 15 MG tablet, Take 1 tablet (15 mg total) by mouth daily. (Patient not taking: Reported on 10/28/2023), Disp: 30 tablet, Rfl: 0   omeprazole  (PRILOSEC) 40 MG capsule, TAKE 1 CAPSULE BY MOUTH TWICE DAILY BEFORE MEAL(S) (Patient not taking: Reported on 10/28/2023), Disp: 60 capsule, Rfl: 0   OZEMPIC, 0.25 OR 0.5 MG/DOSE, 2 MG/1.5ML SOPN, Inject into the skin once a week. Friday (Patient not  taking: Reported on 10/28/2023), Disp: , Rfl:    Phentermine-Topiramate (QSYMIA) 7.5-46 MG CP24, Take by mouth. (Patient not taking: Reported on 10/28/2023), Disp: , Rfl:    pregabalin (LYRICA) 75 MG capsule, , Disp: , Rfl:  Medication Side Effects: Questionable nausea and vomiting from high dose Wellbutrin   Family Medical/ Social History: Changes? no  MENTAL HEALTH EXAM:  There were no vitals taken for this visit.There is no height or weight on file to calculate BMI.  General Appearance: Casual, Well Groomed, and Obese  Eye Contact:  Good  Speech:  Clear and Coherent and Normal Rate  Volume:  Normal  Mood:  Euthymic  Affect:  Congruent  Thought Process:   Goal Directed and Descriptions of Associations: Circumstantial  Orientation:  Full (Time, Place, and Person)  Thought Content: Logical   Suicidal Thoughts:  No  Homicidal Thoughts:  No  Memory:  WNL  Judgement:  Good  Insight:  Good  Psychomotor Activity:  Normal  Concentration:  Attention Span: Good  Recall:  Good  Fund of Knowledge: Good  Language: Good  Assets:  Communication Skills Desire for Improvement Financial Resources/Insurance Housing Resilience Social Support Transportation  ADL's:  Intact  Cognition: WNL  Prognosis:  Good   DIAGNOSES:    ICD-10-CM   1. Treatment-resistant depression  F32.9     2. Generalized anxiety disorder  F41.1       Receiving Psychotherapy: Yes   [facility]  RECOMMENDATIONS:  PDMP reviewed.  Xanax  filled 08/03/2024. I provided approximately  20 minutes of face to face time during this encounter, including time spent before and after the visit in records review, medical decision making, counseling pertinent to today's visit, and charting.   We discussed the nausea.  It is hard to say if it is related to the Wellbutrin  or not.  She plans to see her PCP about it soon.  She would like to retry the higher dose of Wellbutrin  to see if it will help with energy and motivation.  She knows to decrease the dose back if she does have worsening of nausea associated with vomiting.  Unless her primary provider finds another reason for the nausea I may need to get her off the Wellbutrin  altogether.  She understands.  Continue Xanax  0.5 mg, 1 p.o. twice daily as needed. Increase Wellbutrin  XL to 150 mg, 3 p.o. every morning. Continue Lamictal  200 mg, 1 p.o. twice daily. Continue ropinirole  2 mg nightly as needed. Continue BiPap. Continue counseling. Return in 6 months.   Verneita Cooks, PA-C     "

## 2025-02-01 ENCOUNTER — Ambulatory Visit: Admitting: Physician Assistant
